# Patient Record
Sex: Male | Born: 1975
Health system: Southern US, Community
[De-identification: ages and names within clinical notes are randomized; demographics above are authoritative.]

## PROBLEM LIST (undated history)

## (undated) DIAGNOSIS — R569 Unspecified convulsions: Secondary | ICD-10-CM

## (undated) DIAGNOSIS — I10 Essential (primary) hypertension: Secondary | ICD-10-CM

## (undated) DIAGNOSIS — J449 Chronic obstructive pulmonary disease, unspecified: Secondary | ICD-10-CM

## (undated) HISTORY — PX: PROSTATE SURGERY: SHX751

## (undated) HISTORY — PX: BRAIN SURGERY: SHX531

## (undated) HISTORY — PX: HERNIA REPAIR: SHX51

---

## 2004-05-05 ENCOUNTER — Emergency Department (HOSPITAL_COMMUNITY): Admission: EM | Admit: 2004-05-05 | Discharge: 2004-05-06 | Payer: Self-pay | Admitting: Emergency Medicine

## 2007-09-21 ENCOUNTER — Emergency Department: Payer: Self-pay | Admitting: Emergency Medicine

## 2008-09-06 ENCOUNTER — Observation Stay (HOSPITAL_COMMUNITY): Admission: EM | Admit: 2008-09-06 | Discharge: 2008-09-08 | Payer: Self-pay | Admitting: Emergency Medicine

## 2008-09-06 ENCOUNTER — Encounter (INDEPENDENT_AMBULATORY_CARE_PROVIDER_SITE_OTHER): Payer: Self-pay | Admitting: Cardiology

## 2008-09-06 ENCOUNTER — Encounter (INDEPENDENT_AMBULATORY_CARE_PROVIDER_SITE_OTHER): Payer: Self-pay | Admitting: *Deleted

## 2008-09-08 ENCOUNTER — Encounter (INDEPENDENT_AMBULATORY_CARE_PROVIDER_SITE_OTHER): Payer: Self-pay | Admitting: Internal Medicine

## 2008-09-08 ENCOUNTER — Ambulatory Visit: Payer: Self-pay | Admitting: Surgery

## 2008-09-23 ENCOUNTER — Emergency Department (HOSPITAL_COMMUNITY): Admission: EM | Admit: 2008-09-23 | Discharge: 2008-09-24 | Payer: Self-pay | Admitting: Emergency Medicine

## 2008-10-18 ENCOUNTER — Inpatient Hospital Stay (HOSPITAL_COMMUNITY): Admission: AD | Admit: 2008-10-18 | Discharge: 2008-10-21 | Payer: Self-pay | Admitting: Pediatrics

## 2009-07-29 ENCOUNTER — Emergency Department (HOSPITAL_COMMUNITY): Admission: EM | Admit: 2009-07-29 | Discharge: 2009-07-30 | Payer: Self-pay | Admitting: Emergency Medicine

## 2009-10-16 ENCOUNTER — Emergency Department (HOSPITAL_COMMUNITY): Admission: EM | Admit: 2009-10-16 | Discharge: 2009-10-16 | Payer: Self-pay | Admitting: Emergency Medicine

## 2010-08-06 LAB — PROTIME-INR
INR: 1.12 (ref 0.00–1.49)
Prothrombin Time: 14.3 seconds (ref 11.6–15.2)

## 2010-08-06 LAB — SAMPLE TO BLOOD BANK

## 2010-08-06 LAB — DIFFERENTIAL
Basophils Absolute: 0 10*3/uL (ref 0.0–0.1)
Basophils Relative: 0 % (ref 0–1)
Eosinophils Absolute: 0.1 10*3/uL (ref 0.0–0.7)
Eosinophils Relative: 1 % (ref 0–5)
Lymphocytes Relative: 26 % (ref 12–46)
Lymphs Abs: 2.7 10*3/uL (ref 0.7–4.0)
Monocytes Absolute: 0.6 10*3/uL (ref 0.1–1.0)
Monocytes Relative: 6 % (ref 3–12)
Neutro Abs: 6.9 10*3/uL (ref 1.7–7.7)
Neutrophils Relative %: 67 % (ref 43–77)

## 2010-08-06 LAB — CBC
HCT: 30.1 % — ABNORMAL LOW (ref 39.0–52.0)
Hemoglobin: 10.2 g/dL — ABNORMAL LOW (ref 13.0–17.0)
MCHC: 34 g/dL (ref 30.0–36.0)
MCV: 94.6 fL (ref 78.0–100.0)
Platelets: 295 10*3/uL (ref 150–400)
RBC: 3.18 MIL/uL — ABNORMAL LOW (ref 4.22–5.81)
RDW: 13.6 % (ref 11.5–15.5)
WBC: 10.4 10*3/uL (ref 4.0–10.5)

## 2010-08-06 LAB — BASIC METABOLIC PANEL
BUN: 15 mg/dL (ref 6–23)
CO2: 17 mEq/L — ABNORMAL LOW (ref 19–32)
Calcium: 7.9 mg/dL — ABNORMAL LOW (ref 8.4–10.5)
Chloride: 109 mEq/L (ref 96–112)
Creatinine, Ser: 0.95 mg/dL (ref 0.4–1.5)
GFR calc Af Amer: >60 mL/min (ref >60)
GFR calc non Af Amer: >60 mL/min (ref >60)
Glucose, Bld: 171 mg/dL — ABNORMAL HIGH (ref 70–99)
Potassium: 2.9 mEq/L — ABNORMAL LOW (ref 3.5–5.1)
Sodium: 135 mEq/L (ref 135–145)

## 2010-08-06 LAB — APTT: aPTT: 21 seconds — ABNORMAL LOW (ref 24–37)

## 2010-08-13 LAB — PROTIME-INR
INR: 0.96 (ref 0.00–1.49)
Prothrombin Time: 12.7 seconds (ref 11.6–15.2)

## 2010-08-13 LAB — DIFFERENTIAL
Basophils Absolute: 0.1 10*3/uL (ref 0.0–0.1)
Basophils Relative: 1 % (ref 0–1)
Eosinophils Absolute: 0.7 10*3/uL (ref 0.0–0.7)
Eosinophils Relative: 5 % (ref 0–5)
Lymphocytes Relative: 15 % (ref 12–46)
Lymphs Abs: 2.1 10*3/uL (ref 0.7–4.0)
Monocytes Absolute: 1.1 10*3/uL — ABNORMAL HIGH (ref 0.1–1.0)
Monocytes Relative: 8 % (ref 3–12)
Neutro Abs: 10.1 10*3/uL — ABNORMAL HIGH (ref 1.7–7.7)
Neutrophils Relative %: 72 % (ref 43–77)

## 2010-08-13 LAB — CBC
HCT: 47.1 % (ref 39.0–52.0)
Hemoglobin: 15.7 g/dL (ref 13.0–17.0)
MCHC: 33.4 g/dL (ref 30.0–36.0)
MCV: 93.4 fL (ref 78.0–100.0)
Platelets: 287 10*3/uL (ref 150–400)
RBC: 5.04 MIL/uL (ref 4.22–5.81)
RDW: 12.7 % (ref 11.5–15.5)
WBC: 14.1 10*3/uL — ABNORMAL HIGH (ref 4.0–10.5)

## 2010-08-13 LAB — COMPREHENSIVE METABOLIC PANEL
ALT: 117 U/L — ABNORMAL HIGH (ref 0–53)
AST: 49 U/L — ABNORMAL HIGH (ref 0–37)
Albumin: 3.5 g/dL (ref 3.5–5.2)
Alkaline Phosphatase: 74 U/L (ref 39–117)
BUN: 8 mg/dL (ref 6–23)
CO2: 23 mEq/L (ref 19–32)
Calcium: 8.9 mg/dL (ref 8.4–10.5)
Chloride: 105 mEq/L (ref 96–112)
Creatinine, Ser: 0.83 mg/dL (ref 0.4–1.5)
GFR calc Af Amer: >60 mL/min (ref >60)
GFR calc non Af Amer: >60 mL/min (ref >60)
Glucose, Bld: 105 mg/dL — ABNORMAL HIGH (ref 70–99)
Potassium: 3.7 mEq/L (ref 3.5–5.1)
Sodium: 134 mEq/L — ABNORMAL LOW (ref 135–145)
Total Bilirubin: 0.5 mg/dL (ref 0.3–1.2)
Total Protein: 6.7 g/dL (ref 6.0–8.3)

## 2010-08-13 LAB — CK TOTAL AND CKMB (NOT AT ARMC)
CK, MB: 0.4 ng/mL (ref 0.3–4.0)
Relative Index: INVALID (ref 0.0–2.5)
Total CK: 53 U/L (ref 7–232)

## 2010-08-13 LAB — TROPONIN I: Troponin I: <0.01 ng/mL (ref 0.00–0.06)

## 2010-08-13 LAB — APTT: aPTT: 25 seconds (ref 24–37)

## 2010-08-27 LAB — PHENYTOIN LEVEL, TOTAL: Phenytoin Lvl: 13.7 ug/mL (ref 10.0–20.0)

## 2010-08-27 LAB — PROLACTIN
Prolactin: 13.3 ng/mL (ref 2.1–17.1)
Prolactin: 9.8 ng/mL (ref 2.1–17.1)

## 2010-08-28 LAB — CBC
HCT: 42.8 % (ref 39.0–52.0)
Hemoglobin: 14.9 g/dL (ref 13.0–17.0)
MCHC: 34.8 g/dL (ref 30.0–36.0)
MCV: 89.9 fL (ref 78.0–100.0)
Platelets: 274 K/uL (ref 150–400)
RBC: 4.77 MIL/uL (ref 4.22–5.81)
RDW: 12.6 % (ref 11.5–15.5)
WBC: 8.5 K/uL (ref 4.0–10.5)

## 2010-08-28 LAB — BASIC METABOLIC PANEL
BUN: 14 mg/dL (ref 6–23)
CO2: 24 mEq/L (ref 19–32)
Calcium: 9 mg/dL (ref 8.4–10.5)
Chloride: 110 mEq/L (ref 96–112)
Creatinine, Ser: 0.9 mg/dL (ref 0.4–1.5)
GFR calc Af Amer: >60 mL/min (ref >60)
GFR calc non Af Amer: >60 mL/min (ref >60)
Glucose, Bld: 104 mg/dL — ABNORMAL HIGH (ref 70–99)
Potassium: 3.9 mEq/L (ref 3.5–5.1)
Sodium: 139 mEq/L (ref 135–145)

## 2010-08-28 LAB — DIFFERENTIAL
Basophils Absolute: 0.1 K/uL (ref 0.0–0.1)
Basophils Relative: 2 % — ABNORMAL HIGH (ref 0–1)
Eosinophils Absolute: 0.9 10*3/uL — ABNORMAL HIGH (ref 0.0–0.7)
Eosinophils Relative: 11 % — ABNORMAL HIGH (ref 0–5)
Lymphocytes Relative: 26 % (ref 12–46)
Lymphs Abs: 2.2 10*3/uL (ref 0.7–4.0)
Monocytes Absolute: 0.7 10*3/uL (ref 0.1–1.0)
Monocytes Relative: 8 % (ref 3–12)
Neutro Abs: 4.5 K/uL (ref 1.7–7.7)
Neutrophils Relative %: 53 % (ref 43–77)

## 2010-08-28 LAB — CK: Total CK: 121 U/L (ref 7–232)

## 2010-08-29 LAB — T4, FREE: Free T4: 0.98 ng/dL (ref 0.80–1.80)

## 2010-08-29 LAB — DIFFERENTIAL
Basophils Absolute: 0.1 10*3/uL (ref 0.0–0.1)
Basophils Relative: 1 % (ref 0–1)
Eosinophils Absolute: 0.5 10*3/uL (ref 0.0–0.7)
Eosinophils Relative: 6 % — ABNORMAL HIGH (ref 0–5)
Lymphocytes Relative: 21 % (ref 12–46)
Monocytes Absolute: 0.6 10*3/uL (ref 0.1–1.0)

## 2010-08-29 LAB — URINALYSIS, ROUTINE W REFLEX MICROSCOPIC
Bilirubin Urine: NEGATIVE
Ketones, ur: NEGATIVE mg/dL
Nitrite: NEGATIVE
Protein, ur: NEGATIVE mg/dL
pH: 7 (ref 5.0–8.0)

## 2010-08-29 LAB — BASIC METABOLIC PANEL
BUN: 15 mg/dL (ref 6–23)
CO2: 29 mEq/L (ref 19–32)
Chloride: 108 mEq/L (ref 96–112)
Creatinine, Ser: 1.13 mg/dL (ref 0.4–1.5)
Glucose, Bld: 94 mg/dL (ref 70–99)

## 2010-08-29 LAB — POCT I-STAT, CHEM 8
BUN: 11 mg/dL (ref 6–23)
Hemoglobin: 16.3 g/dL (ref 13.0–17.0)
Potassium: 3.8 mEq/L (ref 3.5–5.1)
Sodium: 141 mEq/L (ref 135–145)
TCO2: 23 mmol/L (ref 0–100)

## 2010-08-29 LAB — RAPID URINE DRUG SCREEN, HOSP PERFORMED
Benzodiazepines: NOT DETECTED
Cocaine: NOT DETECTED
Tetrahydrocannabinol: NOT DETECTED

## 2010-08-29 LAB — CBC
HCT: 45.8 % (ref 39.0–52.0)
Hemoglobin: 15.8 g/dL (ref 13.0–17.0)
MCHC: 34.2 g/dL (ref 30.0–36.0)
MCHC: 34.4 g/dL (ref 30.0–36.0)
MCV: 90.5 fL (ref 78.0–100.0)
MCV: 90.7 fL (ref 78.0–100.0)
Platelets: 263 10*3/uL (ref 150–400)
Platelets: 272 10*3/uL (ref 150–400)
RBC: 5.2 MIL/uL (ref 4.22–5.81)
RDW: 11.9 % (ref 11.5–15.5)
RDW: 12 % (ref 11.5–15.5)

## 2010-08-29 LAB — APTT: aPTT: 26 seconds (ref 24–37)

## 2010-08-29 LAB — COMPREHENSIVE METABOLIC PANEL
ALT: 42 U/L (ref 0–53)
AST: 24 U/L (ref 0–37)
Alkaline Phosphatase: 62 U/L (ref 39–117)
Calcium: 8.8 mg/dL (ref 8.4–10.5)
GFR calc Af Amer: >60 mL/min (ref >60)
Potassium: 4.2 mEq/L (ref 3.5–5.1)
Sodium: 141 mEq/L (ref 135–145)
Total Protein: 6.2 g/dL (ref 6.0–8.3)

## 2010-08-29 LAB — LIPID PANEL
Cholesterol: 159 mg/dL (ref 0–200)
LDL Cholesterol: 114 mg/dL — ABNORMAL HIGH (ref 0–99)
Triglycerides: 90 mg/dL (ref <150)

## 2010-08-29 LAB — GLUCOSE, CAPILLARY

## 2010-08-29 LAB — PROTIME-INR: INR: 1.1 (ref 0.00–1.49)

## 2010-10-02 NOTE — Consult Note (Signed)
[name], [name]                 ACCOUNT NO.:  [id number]   MEDICAL RECORD NO.:  [id number]          PATIENT TYPE:  INP   LOCATION:  3309                         FACILITY:  [facility]   PHYSICIAN:  Antonietta Breach, M.D.  DATE OF BIRTH:  17-Sep-1975   DATE OF CONSULTATION:  10/20/2008  DATE OF DISCHARGE:                                 CONSULTATION   REQUESTING PHYSICIAN:  Dr. Pearlean Brownie.   REASON FOR CONSULTATION:  Convulsions without seizure activity.   HISTORY OF PRESENT ILLNESS:  [name] is a 35 year old male admitted to  the [facility] on October 18, 2008, with convulsions.  He has had a  pattern of having generalized shaking in the context of interpersonal  stress in the past.  Most recently, he does not describe any stresses in  his life interpersonally.  He does live with his mother.  He states that  they occasionally have some arguments, but for the most part they are  mutually supportive.  He does work for her around the house.   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  He has displayed generalized shaking  during this hospital course where the neurologist was able was able to  talk him out of the convulsion and [name] was able to converse with  the neurologist during the convulsion.   [name] describes normal mood.  He has normal interests and  constructive future goals.  He does not have any thoughts of harming  himself or others.  He has no hallucinations or delusions.  His  orientation and memory function are intact.   He talks about his interests in fishing and reports that there [place]  [place] home soon.  He looks forward to fishing there.  He does not exhibit any flatness of expression.  Notably, he does not  mention the convulsive activity as a stress in his life when asked about  stress.   PAST PSYCHIATRIC HISTORY:  Unremarkable other than a previous pattern of  convulsions when he  has been having interpersonal difficulty with  disappointing male relationships.   FAMILY PSYCHIATRIC HISTORY:  None known.   SOCIAL HISTORY:  [name] does not use alcohol or illegal drugs.  His  occupation is Holiday representative; however, he has been unemployed due to the  downturn in economy and he has been forced to move back in with his  mother.  Please see the discussion above.   PAST MEDICAL HISTORY:  1. He does have a history of extensive sinus surgery.  2. Convulsions.   Please see Dr. Darl Householder dictation from October 18, 2008.   MEDICATIONS:  The MAR is reviewed.  He is on Diovan 100 mg t.i.d.   ALLERGIES:  HE IS ALLERGIC TO ALLEGRA.   GENERAL APPEARANCE:  [name] is a middle-aged male lying in a supine  position in his hospital bed with no abnormal involuntary movements.   MENTAL STATUS EXAM:  [name] is alert.  His eye contact is good.  His  affect is broad and appropriate.  His mood is mood is within normal  limits.  His concentration is normal.  He is oriented to all spheres.  His memory is intact for immediate, recent and remote.  His speech  involves normal rate and prosody without dysarthria.  He smiles  appropriately as he discusses his fishing interests.  Thought process is  logical, coherent and goal-directed.  No looseness of associations.  Thought content; no thoughts of harming himself, no thoughts of harming  others, no delusions or hallucinations.  Please see the above regarding  the absence of mentioning convulsions as a stress.  His insight is  partial in that he is least verbalized his willingness to accept that he  could have nonictal convulsions produced at the unconscious level.  Judgment is intact.   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  [name] does display some evidence of indifference to his  convulsions during the session.  He is undergoing the stress of having  to move back in with his mother during  an economic downturn and he  voices this as his #1 stress.  Axis II:  Deferred.  Axis III:  See past medical history.  Axis IV:  Economic occupational.  Axis V:  Global Assessment of Functioning 55.   [name] is not at risk to harm himself or others.  He agrees to call  emergency services for any psychiatric emergency symptoms.   RECOMMENDATIONS:  I would have the social worker set [name] up with  psychotherapy.  The psychotherapy needs to be performed by a therapist  trained and experienced in treating somatoform conditions.      Antonietta Breach, M.D.  Electronically Signed     JW/MEDQ  D:  10/20/2008  T:  10/20/2008  Job:  [id number]

## 2010-10-02 NOTE — H&P (Signed)
NAMEDIONNE, ROSSA                 ACCOUNT NO.:  [id number]   MEDICAL RECORD NO.:  [id number]          PATIENT TYPE:  EMS   LOCATION:  MAJO                         FACILITY:  [facility]   PHYSICIAN:  Selena Batten, MD     DATE OF BIRTH:  Nov 26, 1975   DATE OF ADMISSION:  09/05/2008  DATE OF DISCHARGE:                              HISTORY & PHYSICAL   CHIEF COMPLAINT:  Right-sided weakness.   HISTORY OF PRESENT ILLNESS:  This is a 35 year old gentleman with  chronic sinusitis who presents to ED for further evaluation for  generalized weakness.  The patient awoke this morning at 7 a.m. and  noted that right side of his body felt with some numbness and tingling  across it.  After lunch it progressed to the point that he had a  weakness, in particular in the right arm.  He has not had these symptoms  previously.  He denies any recent episodes of fever, chills, nausea,  vomiting.  He denies any episodes of slurred speech.  He has had recent  headaches related to allergies.  He denies any chest pain, shortness of  breath, or dyspnea on exertion.  He denies any orthopnea, PND, or lower  extremity edema.   PAST MEDICAL HISTORY:  Chronic sinusitis.   ALLERGIES:  He is allergic to Allegra-D.   MEDICATIONS:  He is on no medications at home at the present time.   FAMILY HISTORY:  Noncontributory.   SOCIAL HISTORY:  He smokes.  He works as a Corporate investment banker.   REVIEW OF SYSTEMS:  A 14-point review of systems was performed and was  negative except as per HPI.   PHYSICAL EXAMINATION:  VITAL SIGNS:  Blood pressure is 128/86 with a  pulse of 76, and sating 100% on room air.  GENERAL:  No acute distress.  HEAD:  Normocephalic and atraumatic.  EYES:  Pupils equal, round, and reactive to light.  Extraocular muscles  are intact.  NECK:  Supple.  No masses.  No bruits.  No thyromegaly.  LUNGS:  Clear to auscultation bilaterally.  HEART:  Regular rate and rhythm.  No murmur, rubs, or gallops.  ABDOMEN:  Positive bowel sounds, soft, nontender, and nondistended.  EXTREMITIES:  No clubbing, cyanosis, or edema.  NEUROLOGIC:  The patient has right arm weakness relative to the left arm  PSYCHIATRIC:  He has appropriate mood, judgment, and insight.   LABORATORY EVALUATION:  Unrevealing.  CT scan was negative for any acute  abnormality.   ASSESSMENT:  This 35 year old Caucasian male with chronic sinusitis here  with right-sided general weakness.  Generalized weakness.  He spoke to Neurology and they will evaluate the  patient in the morning.  CT scan unrevealing for any acute abnormality.  We will have the patient on aspirin.  We will have the patient set up  for a transthoracic echo with bubble study to evaluate for any right to  left shunting.  In addition, we will put in for carotid ultrasounds to  evaluate for any carotid disease that may have resulted in his symptoms.   DISPOSITION:  Put in for the workup.      Selena Batten, MD  Electronically Signed     BS/MEDQ  D:  09/05/2008  T:  09/06/2008  Job:  [id number]

## 2010-10-02 NOTE — Procedures (Signed)
CLINICAL HISTORY:  This is a 35 year old gentleman, who has had seizure-  like activity noted since 1995.  He has been diagnosed with epilepsy.  He has been on a variety of medications most recently Dilantin.  He has  had exacerbations of his seizures during times of intensive motional  stress.  The patient has had several sinus surgeries.  He also claims to  have had several head injuries. (780.39)   PROCEDURE:  The tracing is carried out on a 32-digital Cadwell recorder  reformatted into 16 channel montages with one devoted to EKG.  The  patient was awake and drowsy during the recording.  Little if any stage  II sleep was seen.  He was monitored for 20.1 hours.  The [employer]  10/20 system lead placement was used.   Medications include Dilantin, Tylenol, and Vicodin.   DESCRIPTION OF FINDINGS:  Dominant frequency during the record is 9-10  Hz 30-35 microvolt alpha range activity that is posteriorly predominant,  broadly distributed, and attenuates with eye opening.  Mixed frequency  lower alpha, upper theta, and frontally predominant beta range activity  characterizes the waking record.   The patient shows drowsiness with mixed frequency theta and upper delta  range activity that is also broadly distributed.  I did not see vertex  sharp waves were symmetric and synchronous.  Sleep spindles during the  entire record.   The patient had 5 episodes of seizure-like behavior during which time  his eyelids were closed.  He turned his head to one side intended to  flex and extend his head, on occasion move it from side to side, and had  rhythmic movements of his arms either in flexion and extension, or again  sometimes from side-to-side.  The first episode extended from 10:00 a.m.  to 10:15 p.m. the second from 11 a.m. to 11:13 a.m., the third from  12:02 p.m. to 12:09 p.m., and forth from 3:45 p.m. to 4:07 p.m., the  last from 9:26 p.m. to 9:45 p.m.   During this time, the  background which shifts from the waking record to  semirhythmic occipital delta range activity usually on the right side  (if his head was turned to the right, otherwise on the left side of his  head was turned to the left.)  Movement and artifact characterized that  these behaviors at times in generalized delta range activity that was  different voltages and somewhat asynchronous characterized the  background.  Throughout all of these periods of time, a low-voltage  background to be seen in multiple leads that were not affected by  movement of his head.  Upon termination of the activity, the waking  record immediately returned.  The episodes were fairly consistent from  time to time and were unassociated with other changes in behavior.   IMPRESSION:  This prolonged video telemetry electroencephalogram shows 5  nonepileptic events that are characteristic of the seizure-like behavior  that has been reported by the patient then seen by numerous witnesses,  but there was no interictal epileptiform activity in the form of spikes  or sharp waves in this record.      Deanna Artis. Sharene Skeans, M.D.  Electronically Signed     ZOX:WRUE  D:  10/21/2008 04:54:22  T:  10/21/2008 06:48:45  Job #:  [id number]

## 2010-10-02 NOTE — Discharge Summary (Signed)
[name], [name]                 ACCOUNT NO.:  [id number]   MEDICAL RECORD NO.:  [id number]          PATIENT TYPE:  INP   LOCATION:  3734                         FACILITY:  [facility]   PHYSICIAN:  Deanna Artis. 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  2. Antalgic gait 781.2.  3. Somatoform disorder, not otherwise specified.   CONSULTS:  Antonietta Breach, MD   DEPARTMENT:  Psychiatry.   SUMMARY OF THE HOSPITALIZATION:  The patient was admitted to [facility] in transfer from [facility] for recurrent seizures that  appeared to be intractable despite treatment with IV Dilantin, three  doses of IV had a than, and finally stop when the patient was put to  sleep with IV phenobarbital.   He was transferred for prolonged video telemetry EEG, which was carried  out between October 19, 2008 and October 20, 2008, interpreted earlier this  morning.   Numerous observers witnessed the behaviors, which associated with slight  extension and flexion of the head, movement of his arms in flexion and  extension, eyelids closed.  The patient was poorly responsive during the  episodes, but immediately upon cessation of them was able to follow  commands even though he did not speak.  The patient was stimulated with  bright light in his eyes, which he is averse to and returned away.  Ammonia placed under his nose increased the amount of his movements.   Five episodes were captured on video EEG, all of them looking the same.  All were associated with movement artifact with normal EEG able to be  seen in those sleeves that were not moving and normal EEG background  immediately on cessation of the movements.   The patient had normal background awake and drowsy.  I did not see light  natural sleep during the EEG.   PHYSICAL  EXAMINATION:  VITAL SIGNS:  Today, blood pressure 105/60,  resting pulse 61, respirations of 16, temperature 97.2, and oxygen  saturation 98% on room air.  GENERAL:  The patient is awake, sleepy.  No dysphasia.  LUNGS:  Clear.  HEART:  No murmurs.  ABDOMEN:  Protuberant.  Bowel sounds normal.  EXTREMITIES:  Scar on the left leg, but no edema.  NEUROLOGIC:  Round and reactive pupils.  Visual fields full to double  simultaneous stimuli.  Symmetric facial strength, midline tongue.  Air  conduction greater than bone conduction bilaterally.  Motor examination:  Normal axial strength in his arms and legs bilaterally.  No drift.  Fine  motor movements normal.  Sensation:  No hemisensory.  Good  stereoagnosis.  Gait:  Antalgic.  The patient is hobbling on his right  leg and says that he has to walk with a cane.  Deep tendon reflexes are  diminished.  The patient had bilateral flexor plantar responses.  CT  scan of the brain shows no evidence of stroke and is identical with CT  performed here  on Sep 23, 2008.  Chest x-ray shows poor inspiration.   LABORATORY STUDIES DURING HOSPITALIZATION:  Prolactin level at baseline  9.8 ng/mL.  Prolactin level during his seizure-like event 13.3 ng/mL.  Dilantin level after a loading dose of Dilantin 13.7 mcg/mL.   Review of the transcription shows he was seen in the emergency room with  a nonepileptic event on Sep 24, 2008, and was discharged home.   I have conveyed to the patient that the behaviors that he has at this  time are nonepileptic and that there is no specific treatment for them.   Recommendations had been made by Dr. Jeanie Sewer for the patient to seek  care in [facility] with a psychotherapist who specializes in  somatoform disorders.  I know none.  The patient will go home on  Dilantin at his same dose.  I do not know in the past if he truly has  had seizures.  He will not be followed up at [facility].  I encouraged to  the patient and his family to keep their  appointment at [facility] on November 03, 2008, for further evaluation.  Since they had seen the patient on  and off since 1995, when these episodes began, he may have more  definitive information as to whether the patient truly has seizures or  whether he has had nonepileptic seizures since that time.      Deanna Artis. Sharene Skeans, M.D.  Electronically Signed     WHH/MEDQ  D:  10/21/2008  T:  10/21/2008  Job:  [id number]   cc:   [facility] Records

## 2010-10-02 NOTE — Discharge Summary (Signed)
NAMEDJON, TITH                 ACCOUNT NO.:  [id number]   MEDICAL RECORD NO.:  [id number]          PATIENT TYPE:  INP   LOCATION:  3034                         FACILITY:  [facility]   PHYSICIAN:  Elliot Cousin, M.D.    DATE OF BIRTH:  [birth year]   DATE OF ADMISSION:  09/05/2008  DATE OF DISCHARGE:  09/08/2008                               DISCHARGE SUMMARY   DISCHARGE DIAGNOSES:  1. Right-sided weakness with paresthesias, etiology possibly      musculoskeletal in origin versus somatization.  2. Mildly elevated LDL cholesterol with normal total cholesterol.  3. Tobacco abuse.   DISCHARGE MEDICATIONS:  Take Tylenol or ibuprofen as directed on the  label and as needed for pain.   DISCHARGE DISPOSITION:  The patient is currently stable and in improved  condition.  He was advised to follow up with the physicians at [facility] in [place], [place] as needed.  He was advised to return  to work on September 12, 2008.   CONSULTATIONS:  None.   PROCEDURES PERFORMED:  1. Carotid duplex study performed on July 11, 2008.  The results      revealed no ICA stenosis.  2. MRI of the brain and MRA of the brain on September 07, 2008.  The      results revealed normal MRI appearance of the brain.  Pansinus      inflammatory changes.  Negative intracranial MRA.  3. MRA of the neck on September 07, 2008.  The results revealed negative      MRA of the neck aside from unusual anatomic variation of the aortic      arch and right vertebral artery origin as detailed.  4. CT scan of the head on September 05, 2008.  The results revealed no      acute intracranial findings.  Chronic sinus disease.  Little change      from priors.  5. It is unclear if 2-D echocardiogram was actually performed,      although it was ordered.  No results were evident.   HISTORY OF PRESENT ILLNESS:  The patient is a 35 year old man with a  past medical history significant for chronic sinusitis and sinus  surgery, who presented  to the emergency department on September 05, 2008  with a chief complaint of right-sided weakness with associated numbness  and tingling.  When he was evaluated in the emergency department, he was  noted to be hemodynamically stable and afebrile.  A CT scan of his head  was ordered and it revealed no acute intracranial abnormality, although  there were signs of chronic sinus disease.  The patient, however, was  admitted for further evaluation and management.   For additional details, please see the dictated history and physical.   HOSPITAL COURSE:  1. RIGHT-SIDED WEAKNESS SECONDARY TO EITHER MUSCULOSKELETAL STRAIN OR      SOMATIZATION.  The patient was evaluated by the registered nurse in      the emergency department for dysphagia.  The initial bedside      swallowing study was within normal limits.  The  patient was started      on aspirin therapy empirically.  A number of studies were ordered      for evaluation of his symptoms, including a urine drug screen,      hemoglobin A1c, fasting lipid panel, 2-D echocardiogram, carotid      Doppler study, MRI of the brain, and MRA of the brain and neck.      The patient's urine drug screen was negative.  His hemoglobin A1c      was within normal limits at 5.5.  His lipid profile revealed a      total cholesterol of 159, triglycerides of 90, HDL cholesterol of      27, and LDL cholesterol of 114.  His electrolytes were within      normal limits and his CBC was within normal limits as well.  TSH      and free T4 were ordered, however, the results are pending.  His      carotid Doppler study revealed no ICA stenosis.  The MRA and MRI      results are above, however, in essence, they were essentially      normal.  The patient was evaluated by the speech therapist when he said he was  having some difficulty swallowing.  The speech therapist evaluated the  patient and saw no evidence of dysphagia or aspiration.  At one point,  the patient was  witnessed ambulating in his room without any significant  weakness.  He had no complaints of neck pain or low back pain.  On my  initial examination on September 07, 2008, the patient was able to pull back  the bed linen with his right hand, although when I actively performed  the strength exam, he demonstrated a weak right hand grip.  Also on  exam, there was no evidence of a right pronator drift.  On my exam  today, the right upper extremity strength was within normal limits.  I  then asked the patient to ambulate and he ambulated without foot drag,  however, there was a limp.  I therefore examined his right lower  extremity and he did have some mild tenderness of the right hip upon  external and internal rotation.  No edema of his right lower extremity  was evident.  There was also no evidence of erythema of his right hip.  Sensation of his right lower extremity was completely intact.  The patient did not appear to have numbness and tingling.  His affect  was somewhat flat on exam.  It is possible that the patient may have  some form of somatization.  It is also possible that he may have some  underlying musculoskeletal strain that presented as weakness.  However,  the right upper extremity weakness which has now resolved, cannot be  explained.  The patient was advised to take Tylenol and/or ibuprofen as  needed.  A cane was ordered prior to hospital discharge.  1. MILDLY ELEVATED LDL AND TOBACCO ABUSE.  The patient was counseled      on a low-fat diet.  He was encouraged to stop smoking.  Tobacco      cessation information was provided to the patient prior to hospital      discharge.   </ADDENDUM>/  The patient's TSH and free T4 were within normal limits.      Elliot Cousin, M.D.  Electronically Signed     DF/MEDQ  D:  09/08/2008  T:  09/08/2008  Job:  [id number] 
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
## 2015-06-06 DIAGNOSIS — R748 Abnormal levels of other serum enzymes: Secondary | ICD-10-CM | POA: Diagnosis not present

## 2015-06-06 DIAGNOSIS — E119 Type 2 diabetes mellitus without complications: Secondary | ICD-10-CM | POA: Diagnosis not present

## 2015-06-13 DIAGNOSIS — Z79899 Other long term (current) drug therapy: Secondary | ICD-10-CM | POA: Diagnosis not present

## 2015-06-26 DIAGNOSIS — R200 Anesthesia of skin: Secondary | ICD-10-CM | POA: Diagnosis not present

## 2015-06-26 DIAGNOSIS — R531 Weakness: Secondary | ICD-10-CM | POA: Diagnosis not present

## 2015-06-26 DIAGNOSIS — I6789 Other cerebrovascular disease: Secondary | ICD-10-CM | POA: Diagnosis not present

## 2015-07-14 DIAGNOSIS — F445 Conversion disorder with seizures or convulsions: Secondary | ICD-10-CM | POA: Diagnosis not present

## 2015-07-14 DIAGNOSIS — G4459 Other complicated headache syndrome: Secondary | ICD-10-CM | POA: Diagnosis not present

## 2015-07-17 DIAGNOSIS — E119 Type 2 diabetes mellitus without complications: Secondary | ICD-10-CM | POA: Diagnosis not present

## 2015-07-17 DIAGNOSIS — M5137 Other intervertebral disc degeneration, lumbosacral region: Secondary | ICD-10-CM | POA: Diagnosis not present

## 2015-07-17 DIAGNOSIS — E790 Hyperuricemia without signs of inflammatory arthritis and tophaceous disease: Secondary | ICD-10-CM | POA: Diagnosis not present

## 2015-07-17 DIAGNOSIS — R252 Cramp and spasm: Secondary | ICD-10-CM | POA: Diagnosis not present

## 2015-07-17 DIAGNOSIS — R569 Unspecified convulsions: Secondary | ICD-10-CM | POA: Diagnosis not present

## 2015-07-17 DIAGNOSIS — R269 Unspecified abnormalities of gait and mobility: Secondary | ICD-10-CM | POA: Diagnosis not present

## 2015-07-17 DIAGNOSIS — G40909 Epilepsy, unspecified, not intractable, without status epilepticus: Secondary | ICD-10-CM | POA: Diagnosis not present

## 2015-07-17 DIAGNOSIS — I10 Essential (primary) hypertension: Secondary | ICD-10-CM | POA: Diagnosis not present

## 2015-07-17 DIAGNOSIS — E785 Hyperlipidemia, unspecified: Secondary | ICD-10-CM | POA: Diagnosis not present

## 2015-07-17 DIAGNOSIS — E039 Hypothyroidism, unspecified: Secondary | ICD-10-CM | POA: Diagnosis not present

## 2015-07-17 DIAGNOSIS — E1165 Type 2 diabetes mellitus with hyperglycemia: Secondary | ICD-10-CM | POA: Diagnosis not present

## 2015-07-17 DIAGNOSIS — E669 Obesity, unspecified: Secondary | ICD-10-CM | POA: Diagnosis not present

## 2015-07-17 DIAGNOSIS — E782 Mixed hyperlipidemia: Secondary | ICD-10-CM | POA: Diagnosis not present

## 2015-07-24 DIAGNOSIS — J45901 Unspecified asthma with (acute) exacerbation: Secondary | ICD-10-CM | POA: Diagnosis present

## 2015-07-24 DIAGNOSIS — Z880 Allergy status to penicillin: Secondary | ICD-10-CM | POA: Diagnosis not present

## 2015-07-24 DIAGNOSIS — J9621 Acute and chronic respiratory failure with hypoxia: Secondary | ICD-10-CM | POA: Diagnosis present

## 2015-07-24 DIAGNOSIS — R0602 Shortness of breath: Secondary | ICD-10-CM | POA: Diagnosis not present

## 2015-07-24 DIAGNOSIS — Z79899 Other long term (current) drug therapy: Secondary | ICD-10-CM | POA: Diagnosis not present

## 2015-07-24 DIAGNOSIS — J209 Acute bronchitis, unspecified: Secondary | ICD-10-CM | POA: Diagnosis present

## 2015-07-24 DIAGNOSIS — F319 Bipolar disorder, unspecified: Secondary | ICD-10-CM | POA: Diagnosis present

## 2015-07-24 DIAGNOSIS — J441 Chronic obstructive pulmonary disease with (acute) exacerbation: Secondary | ICD-10-CM | POA: Diagnosis present

## 2015-07-24 DIAGNOSIS — J440 Chronic obstructive pulmonary disease with acute lower respiratory infection: Secondary | ICD-10-CM | POA: Diagnosis present

## 2015-07-24 DIAGNOSIS — K219 Gastro-esophageal reflux disease without esophagitis: Secondary | ICD-10-CM | POA: Diagnosis present

## 2015-07-24 DIAGNOSIS — J9690 Respiratory failure, unspecified, unspecified whether with hypoxia or hypercapnia: Secondary | ICD-10-CM | POA: Diagnosis not present

## 2015-07-24 DIAGNOSIS — G40909 Epilepsy, unspecified, not intractable, without status epilepticus: Secondary | ICD-10-CM | POA: Diagnosis present

## 2015-07-24 DIAGNOSIS — J45909 Unspecified asthma, uncomplicated: Secondary | ICD-10-CM | POA: Diagnosis not present

## 2015-07-24 DIAGNOSIS — J9620 Acute and chronic respiratory failure, unspecified whether with hypoxia or hypercapnia: Secondary | ICD-10-CM | POA: Diagnosis not present

## 2015-07-24 DIAGNOSIS — R69 Illness, unspecified: Secondary | ICD-10-CM | POA: Diagnosis not present

## 2015-07-24 DIAGNOSIS — F17210 Nicotine dependence, cigarettes, uncomplicated: Secondary | ICD-10-CM | POA: Diagnosis present

## 2015-07-24 DIAGNOSIS — I10 Essential (primary) hypertension: Secondary | ICD-10-CM | POA: Diagnosis present

## 2015-07-24 DIAGNOSIS — Z888 Allergy status to other drugs, medicaments and biological substances status: Secondary | ICD-10-CM | POA: Diagnosis not present

## 2015-07-24 DIAGNOSIS — J9600 Acute respiratory failure, unspecified whether with hypoxia or hypercapnia: Secondary | ICD-10-CM | POA: Diagnosis not present

## 2015-07-24 DIAGNOSIS — Z8673 Personal history of transient ischemic attack (TIA), and cerebral infarction without residual deficits: Secondary | ICD-10-CM | POA: Diagnosis not present

## 2015-09-08 DIAGNOSIS — M5416 Radiculopathy, lumbar region: Secondary | ICD-10-CM | POA: Diagnosis not present

## 2015-09-08 DIAGNOSIS — F329 Major depressive disorder, single episode, unspecified: Secondary | ICD-10-CM | POA: Diagnosis not present

## 2015-09-08 DIAGNOSIS — G894 Chronic pain syndrome: Secondary | ICD-10-CM | POA: Diagnosis not present

## 2015-09-08 DIAGNOSIS — M5136 Other intervertebral disc degeneration, lumbar region: Secondary | ICD-10-CM | POA: Diagnosis not present

## 2015-10-12 DIAGNOSIS — E782 Mixed hyperlipidemia: Secondary | ICD-10-CM | POA: Diagnosis not present

## 2015-10-12 DIAGNOSIS — F411 Generalized anxiety disorder: Secondary | ICD-10-CM | POA: Diagnosis not present

## 2015-10-12 DIAGNOSIS — E6609 Other obesity due to excess calories: Secondary | ICD-10-CM | POA: Diagnosis not present

## 2015-10-12 DIAGNOSIS — E1165 Type 2 diabetes mellitus with hyperglycemia: Secondary | ICD-10-CM | POA: Diagnosis not present

## 2015-10-12 DIAGNOSIS — G40909 Epilepsy, unspecified, not intractable, without status epilepticus: Secondary | ICD-10-CM | POA: Diagnosis not present

## 2015-10-12 DIAGNOSIS — R05 Cough: Secondary | ICD-10-CM | POA: Diagnosis not present

## 2015-10-12 DIAGNOSIS — I10 Essential (primary) hypertension: Secondary | ICD-10-CM | POA: Diagnosis not present

## 2015-10-12 DIAGNOSIS — E790 Hyperuricemia without signs of inflammatory arthritis and tophaceous disease: Secondary | ICD-10-CM | POA: Diagnosis not present

## 2015-10-12 DIAGNOSIS — R569 Unspecified convulsions: Secondary | ICD-10-CM | POA: Diagnosis not present

## 2015-10-12 DIAGNOSIS — E559 Vitamin D deficiency, unspecified: Secondary | ICD-10-CM | POA: Diagnosis not present

## 2015-10-12 DIAGNOSIS — E119 Type 2 diabetes mellitus without complications: Secondary | ICD-10-CM | POA: Diagnosis not present

## 2015-10-12 DIAGNOSIS — E039 Hypothyroidism, unspecified: Secondary | ICD-10-CM | POA: Diagnosis not present

## 2015-10-12 DIAGNOSIS — R252 Cramp and spasm: Secondary | ICD-10-CM | POA: Diagnosis not present

## 2015-10-12 DIAGNOSIS — F17210 Nicotine dependence, cigarettes, uncomplicated: Secondary | ICD-10-CM | POA: Diagnosis not present

## 2015-10-15 DIAGNOSIS — R569 Unspecified convulsions: Secondary | ICD-10-CM | POA: Diagnosis not present

## 2015-10-15 DIAGNOSIS — G4089 Other seizures: Secondary | ICD-10-CM | POA: Diagnosis not present

## 2015-10-15 DIAGNOSIS — R079 Chest pain, unspecified: Secondary | ICD-10-CM | POA: Diagnosis not present

## 2015-10-15 DIAGNOSIS — R0789 Other chest pain: Secondary | ICD-10-CM | POA: Diagnosis not present

## 2015-10-16 DIAGNOSIS — T310 Burns involving less than 10% of body surface: Secondary | ICD-10-CM | POA: Diagnosis not present

## 2015-10-16 DIAGNOSIS — T300 Burn of unspecified body region, unspecified degree: Secondary | ICD-10-CM | POA: Diagnosis not present

## 2015-10-16 DIAGNOSIS — T25299A Burn of second degree of multiple sites of unspecified ankle and foot, initial encounter: Secondary | ICD-10-CM | POA: Diagnosis not present

## 2015-10-16 DIAGNOSIS — T25212A Burn of second degree of left ankle, initial encounter: Secondary | ICD-10-CM | POA: Diagnosis not present

## 2015-10-16 DIAGNOSIS — X0801XA Exposure to bed fire due to burning cigarette, initial encounter: Secondary | ICD-10-CM | POA: Diagnosis not present

## 2015-10-16 DIAGNOSIS — T25211A Burn of second degree of right ankle, initial encounter: Secondary | ICD-10-CM | POA: Diagnosis not present

## 2015-10-23 DIAGNOSIS — T310 Burns involving less than 10% of body surface: Secondary | ICD-10-CM | POA: Diagnosis not present

## 2015-10-23 DIAGNOSIS — G4730 Sleep apnea, unspecified: Secondary | ICD-10-CM | POA: Diagnosis not present

## 2015-10-23 DIAGNOSIS — F17200 Nicotine dependence, unspecified, uncomplicated: Secondary | ICD-10-CM | POA: Diagnosis not present

## 2015-10-23 DIAGNOSIS — I10 Essential (primary) hypertension: Secondary | ICD-10-CM | POA: Diagnosis not present

## 2015-10-23 DIAGNOSIS — E119 Type 2 diabetes mellitus without complications: Secondary | ICD-10-CM | POA: Diagnosis not present

## 2015-10-23 DIAGNOSIS — J45909 Unspecified asthma, uncomplicated: Secondary | ICD-10-CM | POA: Diagnosis not present

## 2015-10-23 DIAGNOSIS — J449 Chronic obstructive pulmonary disease, unspecified: Secondary | ICD-10-CM | POA: Diagnosis not present

## 2015-10-23 DIAGNOSIS — G40909 Epilepsy, unspecified, not intractable, without status epilepticus: Secondary | ICD-10-CM | POA: Diagnosis not present

## 2015-10-23 DIAGNOSIS — T24202A Burn of second degree of unspecified site of left lower limb, except ankle and foot, initial encounter: Secondary | ICD-10-CM | POA: Diagnosis not present

## 2015-10-23 DIAGNOSIS — T24232A Burn of second degree of left lower leg, initial encounter: Secondary | ICD-10-CM | POA: Diagnosis not present

## 2015-10-26 DIAGNOSIS — Z79899 Other long term (current) drug therapy: Secondary | ICD-10-CM | POA: Diagnosis not present

## 2015-10-26 DIAGNOSIS — Z8673 Personal history of transient ischemic attack (TIA), and cerebral infarction without residual deficits: Secondary | ICD-10-CM | POA: Diagnosis not present

## 2015-10-26 DIAGNOSIS — E785 Hyperlipidemia, unspecified: Secondary | ICD-10-CM | POA: Diagnosis not present

## 2015-10-26 DIAGNOSIS — E119 Type 2 diabetes mellitus without complications: Secondary | ICD-10-CM | POA: Diagnosis not present

## 2015-10-26 DIAGNOSIS — G40909 Epilepsy, unspecified, not intractable, without status epilepticus: Secondary | ICD-10-CM | POA: Diagnosis not present

## 2015-10-26 DIAGNOSIS — T24032A Burn of unspecified degree of left lower leg, initial encounter: Secondary | ICD-10-CM | POA: Diagnosis not present

## 2015-10-26 DIAGNOSIS — F419 Anxiety disorder, unspecified: Secondary | ICD-10-CM | POA: Diagnosis not present

## 2015-10-26 DIAGNOSIS — S81802A Unspecified open wound, left lower leg, initial encounter: Secondary | ICD-10-CM | POA: Diagnosis not present

## 2015-10-26 DIAGNOSIS — G4730 Sleep apnea, unspecified: Secondary | ICD-10-CM | POA: Diagnosis not present

## 2015-10-26 DIAGNOSIS — J45909 Unspecified asthma, uncomplicated: Secondary | ICD-10-CM | POA: Diagnosis not present

## 2015-10-26 DIAGNOSIS — I10 Essential (primary) hypertension: Secondary | ICD-10-CM | POA: Diagnosis not present

## 2015-10-26 DIAGNOSIS — J449 Chronic obstructive pulmonary disease, unspecified: Secondary | ICD-10-CM | POA: Diagnosis not present

## 2015-10-26 DIAGNOSIS — K219 Gastro-esophageal reflux disease without esophagitis: Secondary | ICD-10-CM | POA: Diagnosis not present

## 2015-10-26 DIAGNOSIS — F319 Bipolar disorder, unspecified: Secondary | ICD-10-CM | POA: Diagnosis not present

## 2015-10-26 DIAGNOSIS — T310 Burns involving less than 10% of body surface: Secondary | ICD-10-CM | POA: Diagnosis not present

## 2015-10-26 DIAGNOSIS — T24202A Burn of second degree of unspecified site of left lower limb, except ankle and foot, initial encounter: Secondary | ICD-10-CM | POA: Diagnosis not present

## 2015-10-26 DIAGNOSIS — F17210 Nicotine dependence, cigarettes, uncomplicated: Secondary | ICD-10-CM | POA: Diagnosis not present

## 2015-10-27 DIAGNOSIS — M79662 Pain in left lower leg: Secondary | ICD-10-CM | POA: Diagnosis not present

## 2015-10-27 DIAGNOSIS — M79605 Pain in left leg: Secondary | ICD-10-CM | POA: Diagnosis not present

## 2015-10-27 DIAGNOSIS — Z4801 Encounter for change or removal of surgical wound dressing: Secondary | ICD-10-CM | POA: Diagnosis not present

## 2015-10-27 DIAGNOSIS — T8189XA Other complications of procedures, not elsewhere classified, initial encounter: Secondary | ICD-10-CM | POA: Diagnosis not present

## 2015-10-27 DIAGNOSIS — G8918 Other acute postprocedural pain: Secondary | ICD-10-CM | POA: Diagnosis not present

## 2015-10-27 DIAGNOSIS — M25572 Pain in left ankle and joints of left foot: Secondary | ICD-10-CM | POA: Diagnosis not present

## 2015-11-07 DIAGNOSIS — Z4801 Encounter for change or removal of surgical wound dressing: Secondary | ICD-10-CM | POA: Diagnosis not present

## 2015-11-07 DIAGNOSIS — T310 Burns involving less than 10% of body surface: Secondary | ICD-10-CM | POA: Diagnosis not present

## 2015-11-07 DIAGNOSIS — T24032A Burn of unspecified degree of left lower leg, initial encounter: Secondary | ICD-10-CM | POA: Diagnosis not present

## 2015-11-07 DIAGNOSIS — R52 Pain, unspecified: Secondary | ICD-10-CM | POA: Diagnosis not present

## 2015-11-07 DIAGNOSIS — M79605 Pain in left leg: Secondary | ICD-10-CM | POA: Diagnosis not present

## 2015-11-09 DIAGNOSIS — S81802D Unspecified open wound, left lower leg, subsequent encounter: Secondary | ICD-10-CM | POA: Diagnosis not present

## 2015-11-14 DIAGNOSIS — Z888 Allergy status to other drugs, medicaments and biological substances status: Secondary | ICD-10-CM | POA: Diagnosis not present

## 2015-11-14 DIAGNOSIS — D649 Anemia, unspecified: Secondary | ICD-10-CM | POA: Diagnosis not present

## 2015-11-14 DIAGNOSIS — K5903 Drug induced constipation: Secondary | ICD-10-CM | POA: Diagnosis not present

## 2015-11-14 DIAGNOSIS — K5900 Constipation, unspecified: Secondary | ICD-10-CM | POA: Diagnosis not present

## 2015-11-14 DIAGNOSIS — T25312A Burn of third degree of left ankle, initial encounter: Secondary | ICD-10-CM | POA: Diagnosis not present

## 2015-11-14 DIAGNOSIS — I10 Essential (primary) hypertension: Secondary | ICD-10-CM | POA: Diagnosis not present

## 2015-11-14 DIAGNOSIS — J45909 Unspecified asthma, uncomplicated: Secondary | ICD-10-CM | POA: Diagnosis not present

## 2015-11-14 DIAGNOSIS — Z9119 Patient's noncompliance with other medical treatment and regimen: Secondary | ICD-10-CM | POA: Diagnosis not present

## 2015-11-14 DIAGNOSIS — E119 Type 2 diabetes mellitus without complications: Secondary | ICD-10-CM | POA: Diagnosis not present

## 2015-11-14 DIAGNOSIS — K219 Gastro-esophageal reflux disease without esophagitis: Secondary | ICD-10-CM | POA: Diagnosis not present

## 2015-11-14 DIAGNOSIS — K921 Melena: Secondary | ICD-10-CM | POA: Diagnosis not present

## 2015-11-14 DIAGNOSIS — R6889 Other general symptoms and signs: Secondary | ICD-10-CM | POA: Diagnosis not present

## 2015-11-14 DIAGNOSIS — Z8719 Personal history of other diseases of the digestive system: Secondary | ICD-10-CM | POA: Diagnosis not present

## 2015-11-14 DIAGNOSIS — G40909 Epilepsy, unspecified, not intractable, without status epilepticus: Secondary | ICD-10-CM | POA: Diagnosis not present

## 2015-11-14 DIAGNOSIS — G43401 Hemiplegic migraine, not intractable, with status migrainosus: Secondary | ICD-10-CM | POA: Diagnosis not present

## 2015-11-14 DIAGNOSIS — F17210 Nicotine dependence, cigarettes, uncomplicated: Secondary | ICD-10-CM | POA: Diagnosis not present

## 2015-11-14 DIAGNOSIS — G8911 Acute pain due to trauma: Secondary | ICD-10-CM | POA: Diagnosis not present

## 2015-11-14 DIAGNOSIS — S81802D Unspecified open wound, left lower leg, subsequent encounter: Secondary | ICD-10-CM | POA: Diagnosis not present

## 2015-11-14 DIAGNOSIS — J329 Chronic sinusitis, unspecified: Secondary | ICD-10-CM | POA: Diagnosis not present

## 2015-11-16 DIAGNOSIS — T24392A Burn of third degree of multiple sites of left lower limb, except ankle and foot, initial encounter: Secondary | ICD-10-CM | POA: Diagnosis not present

## 2015-11-16 DIAGNOSIS — T25322D Burn of third degree of left foot, subsequent encounter: Secondary | ICD-10-CM | POA: Diagnosis not present

## 2015-11-16 DIAGNOSIS — G8911 Acute pain due to trauma: Secondary | ICD-10-CM | POA: Diagnosis not present

## 2015-11-16 DIAGNOSIS — G4733 Obstructive sleep apnea (adult) (pediatric): Secondary | ICD-10-CM | POA: Diagnosis not present

## 2015-11-16 DIAGNOSIS — F319 Bipolar disorder, unspecified: Secondary | ICD-10-CM | POA: Diagnosis not present

## 2015-11-16 DIAGNOSIS — M545 Low back pain: Secondary | ICD-10-CM | POA: Diagnosis not present

## 2015-11-16 DIAGNOSIS — T310 Burns involving less than 10% of body surface: Secondary | ICD-10-CM | POA: Diagnosis not present

## 2015-11-16 DIAGNOSIS — T25312D Burn of third degree of left ankle, subsequent encounter: Secondary | ICD-10-CM | POA: Diagnosis not present

## 2015-11-16 DIAGNOSIS — T24302D Burn of third degree of unspecified site of left lower limb, except ankle and foot, subsequent encounter: Secondary | ICD-10-CM | POA: Diagnosis not present

## 2015-11-16 DIAGNOSIS — G40909 Epilepsy, unspecified, not intractable, without status epilepticus: Secondary | ICD-10-CM | POA: Diagnosis not present

## 2015-11-16 DIAGNOSIS — J449 Chronic obstructive pulmonary disease, unspecified: Secondary | ICD-10-CM | POA: Diagnosis not present

## 2015-11-16 DIAGNOSIS — Z886 Allergy status to analgesic agent status: Secondary | ICD-10-CM | POA: Diagnosis not present

## 2015-11-16 DIAGNOSIS — G43409 Hemiplegic migraine, not intractable, without status migrainosus: Secondary | ICD-10-CM | POA: Diagnosis not present

## 2015-11-16 DIAGNOSIS — J45909 Unspecified asthma, uncomplicated: Secondary | ICD-10-CM | POA: Diagnosis not present

## 2015-11-16 DIAGNOSIS — M5136 Other intervertebral disc degeneration, lumbar region: Secondary | ICD-10-CM | POA: Diagnosis not present

## 2015-11-16 DIAGNOSIS — E119 Type 2 diabetes mellitus without complications: Secondary | ICD-10-CM | POA: Diagnosis not present

## 2015-11-16 DIAGNOSIS — E669 Obesity, unspecified: Secondary | ICD-10-CM | POA: Diagnosis not present

## 2015-11-16 DIAGNOSIS — F17210 Nicotine dependence, cigarettes, uncomplicated: Secondary | ICD-10-CM | POA: Diagnosis not present

## 2015-11-16 DIAGNOSIS — I10 Essential (primary) hypertension: Secondary | ICD-10-CM | POA: Diagnosis not present

## 2015-11-16 DIAGNOSIS — Z6834 Body mass index (BMI) 34.0-34.9, adult: Secondary | ICD-10-CM | POA: Diagnosis not present

## 2015-11-16 DIAGNOSIS — K219 Gastro-esophageal reflux disease without esophagitis: Secondary | ICD-10-CM | POA: Diagnosis not present

## 2015-11-16 DIAGNOSIS — Z880 Allergy status to penicillin: Secondary | ICD-10-CM | POA: Diagnosis not present

## 2015-11-16 DIAGNOSIS — J329 Chronic sinusitis, unspecified: Secondary | ICD-10-CM | POA: Diagnosis not present

## 2015-11-22 DIAGNOSIS — T310 Burns involving less than 10% of body surface: Secondary | ICD-10-CM | POA: Diagnosis not present

## 2015-11-22 DIAGNOSIS — Z881 Allergy status to other antibiotic agents status: Secondary | ICD-10-CM | POA: Diagnosis not present

## 2015-11-22 DIAGNOSIS — T25312D Burn of third degree of left ankle, subsequent encounter: Secondary | ICD-10-CM | POA: Diagnosis not present

## 2015-12-25 DIAGNOSIS — T310 Burns involving less than 10% of body surface: Secondary | ICD-10-CM | POA: Diagnosis not present

## 2015-12-25 DIAGNOSIS — T24002D Burn of unspecified degree of unspecified site of left lower limb, except ankle and foot, subsequent encounter: Secondary | ICD-10-CM | POA: Diagnosis not present

## 2016-01-11 DIAGNOSIS — F411 Generalized anxiety disorder: Secondary | ICD-10-CM | POA: Diagnosis not present

## 2016-01-11 DIAGNOSIS — G40509 Epileptic seizures related to external causes, not intractable, without status epilepticus: Secondary | ICD-10-CM | POA: Diagnosis not present

## 2016-01-11 DIAGNOSIS — E039 Hypothyroidism, unspecified: Secondary | ICD-10-CM | POA: Diagnosis not present

## 2016-01-11 DIAGNOSIS — E785 Hyperlipidemia, unspecified: Secondary | ICD-10-CM | POA: Diagnosis not present

## 2016-01-11 DIAGNOSIS — E119 Type 2 diabetes mellitus without complications: Secondary | ICD-10-CM | POA: Diagnosis not present

## 2016-01-11 DIAGNOSIS — E1165 Type 2 diabetes mellitus with hyperglycemia: Secondary | ICD-10-CM | POA: Diagnosis not present

## 2016-01-11 DIAGNOSIS — E782 Mixed hyperlipidemia: Secondary | ICD-10-CM | POA: Diagnosis not present

## 2016-01-11 DIAGNOSIS — E559 Vitamin D deficiency, unspecified: Secondary | ICD-10-CM | POA: Diagnosis not present

## 2016-01-11 DIAGNOSIS — I10 Essential (primary) hypertension: Secondary | ICD-10-CM | POA: Diagnosis not present

## 2016-01-11 DIAGNOSIS — E790 Hyperuricemia without signs of inflammatory arthritis and tophaceous disease: Secondary | ICD-10-CM | POA: Diagnosis not present

## 2016-01-11 DIAGNOSIS — R262 Difficulty in walking, not elsewhere classified: Secondary | ICD-10-CM | POA: Diagnosis not present

## 2016-02-06 DIAGNOSIS — F332 Major depressive disorder, recurrent severe without psychotic features: Secondary | ICD-10-CM | POA: Diagnosis not present

## 2016-02-19 DIAGNOSIS — Z886 Allergy status to analgesic agent status: Secondary | ICD-10-CM | POA: Diagnosis not present

## 2016-02-19 DIAGNOSIS — Z880 Allergy status to penicillin: Secondary | ICD-10-CM | POA: Diagnosis not present

## 2016-02-19 DIAGNOSIS — G8911 Acute pain due to trauma: Secondary | ICD-10-CM | POA: Diagnosis not present

## 2016-02-19 DIAGNOSIS — E119 Type 2 diabetes mellitus without complications: Secondary | ICD-10-CM | POA: Diagnosis not present

## 2016-02-19 DIAGNOSIS — T25312D Burn of third degree of left ankle, subsequent encounter: Secondary | ICD-10-CM | POA: Diagnosis not present

## 2016-02-19 DIAGNOSIS — T310 Burns involving less than 10% of body surface: Secondary | ICD-10-CM | POA: Diagnosis not present

## 2016-02-19 DIAGNOSIS — Z79899 Other long term (current) drug therapy: Secondary | ICD-10-CM | POA: Diagnosis not present

## 2016-02-19 DIAGNOSIS — G40909 Epilepsy, unspecified, not intractable, without status epilepticus: Secondary | ICD-10-CM | POA: Diagnosis not present

## 2016-02-19 DIAGNOSIS — Z881 Allergy status to other antibiotic agents status: Secondary | ICD-10-CM | POA: Diagnosis not present

## 2016-02-19 DIAGNOSIS — J449 Chronic obstructive pulmonary disease, unspecified: Secondary | ICD-10-CM | POA: Diagnosis not present

## 2016-02-19 DIAGNOSIS — I10 Essential (primary) hypertension: Secondary | ICD-10-CM | POA: Diagnosis not present

## 2016-02-19 DIAGNOSIS — Z888 Allergy status to other drugs, medicaments and biological substances status: Secondary | ICD-10-CM | POA: Diagnosis not present

## 2016-02-19 DIAGNOSIS — F17200 Nicotine dependence, unspecified, uncomplicated: Secondary | ICD-10-CM | POA: Diagnosis not present

## 2016-02-19 DIAGNOSIS — Z7951 Long term (current) use of inhaled steroids: Secondary | ICD-10-CM | POA: Diagnosis not present

## 2016-03-04 DIAGNOSIS — G4459 Other complicated headache syndrome: Secondary | ICD-10-CM | POA: Diagnosis not present

## 2016-03-04 DIAGNOSIS — F445 Conversion disorder with seizures or convulsions: Secondary | ICD-10-CM | POA: Diagnosis not present

## 2016-03-04 DIAGNOSIS — G40309 Generalized idiopathic epilepsy and epileptic syndromes, not intractable, without status epilepticus: Secondary | ICD-10-CM | POA: Diagnosis not present

## 2016-03-18 DIAGNOSIS — F332 Major depressive disorder, recurrent severe without psychotic features: Secondary | ICD-10-CM | POA: Diagnosis not present

## 2016-03-29 DIAGNOSIS — M5416 Radiculopathy, lumbar region: Secondary | ICD-10-CM | POA: Diagnosis not present

## 2016-03-29 DIAGNOSIS — M5136 Other intervertebral disc degeneration, lumbar region: Secondary | ICD-10-CM | POA: Diagnosis not present

## 2016-03-29 DIAGNOSIS — F329 Major depressive disorder, single episode, unspecified: Secondary | ICD-10-CM | POA: Diagnosis not present

## 2016-03-29 DIAGNOSIS — G894 Chronic pain syndrome: Secondary | ICD-10-CM | POA: Diagnosis not present

## 2016-04-08 DIAGNOSIS — F332 Major depressive disorder, recurrent severe without psychotic features: Secondary | ICD-10-CM | POA: Diagnosis not present

## 2016-04-10 DIAGNOSIS — E039 Hypothyroidism, unspecified: Secondary | ICD-10-CM | POA: Diagnosis not present

## 2016-04-10 DIAGNOSIS — E6609 Other obesity due to excess calories: Secondary | ICD-10-CM | POA: Diagnosis not present

## 2016-04-10 DIAGNOSIS — E119 Type 2 diabetes mellitus without complications: Secondary | ICD-10-CM | POA: Diagnosis not present

## 2016-04-10 DIAGNOSIS — E559 Vitamin D deficiency, unspecified: Secondary | ICD-10-CM | POA: Diagnosis not present

## 2016-04-10 DIAGNOSIS — E782 Mixed hyperlipidemia: Secondary | ICD-10-CM | POA: Diagnosis not present

## 2016-04-10 DIAGNOSIS — F411 Generalized anxiety disorder: Secondary | ICD-10-CM | POA: Diagnosis not present

## 2016-04-10 DIAGNOSIS — I10 Essential (primary) hypertension: Secondary | ICD-10-CM | POA: Diagnosis not present

## 2016-04-10 DIAGNOSIS — E790 Hyperuricemia without signs of inflammatory arthritis and tophaceous disease: Secondary | ICD-10-CM | POA: Diagnosis not present

## 2016-04-10 DIAGNOSIS — E1165 Type 2 diabetes mellitus with hyperglycemia: Secondary | ICD-10-CM | POA: Diagnosis not present

## 2016-04-23 DIAGNOSIS — Z888 Allergy status to other drugs, medicaments and biological substances status: Secondary | ICD-10-CM | POA: Diagnosis not present

## 2016-04-23 DIAGNOSIS — R069 Unspecified abnormalities of breathing: Secondary | ICD-10-CM | POA: Diagnosis not present

## 2016-04-23 DIAGNOSIS — F418 Other specified anxiety disorders: Secondary | ICD-10-CM | POA: Diagnosis present

## 2016-04-23 DIAGNOSIS — J189 Pneumonia, unspecified organism: Secondary | ICD-10-CM | POA: Diagnosis not present

## 2016-04-23 DIAGNOSIS — I10 Essential (primary) hypertension: Secondary | ICD-10-CM | POA: Diagnosis present

## 2016-04-23 DIAGNOSIS — R739 Hyperglycemia, unspecified: Secondary | ICD-10-CM | POA: Diagnosis present

## 2016-04-23 DIAGNOSIS — J9600 Acute respiratory failure, unspecified whether with hypoxia or hypercapnia: Secondary | ICD-10-CM | POA: Diagnosis not present

## 2016-04-23 DIAGNOSIS — R079 Chest pain, unspecified: Secondary | ICD-10-CM | POA: Diagnosis not present

## 2016-04-23 DIAGNOSIS — Z8673 Personal history of transient ischemic attack (TIA), and cerebral infarction without residual deficits: Secondary | ICD-10-CM | POA: Diagnosis not present

## 2016-04-23 DIAGNOSIS — R0600 Dyspnea, unspecified: Secondary | ICD-10-CM | POA: Diagnosis not present

## 2016-04-23 DIAGNOSIS — Z881 Allergy status to other antibiotic agents status: Secondary | ICD-10-CM | POA: Diagnosis not present

## 2016-04-23 DIAGNOSIS — J45909 Unspecified asthma, uncomplicated: Secondary | ICD-10-CM | POA: Diagnosis not present

## 2016-04-23 DIAGNOSIS — Z79899 Other long term (current) drug therapy: Secondary | ICD-10-CM | POA: Diagnosis not present

## 2016-04-23 DIAGNOSIS — R55 Syncope and collapse: Secondary | ICD-10-CM | POA: Diagnosis not present

## 2016-04-23 DIAGNOSIS — J4540 Moderate persistent asthma, uncomplicated: Secondary | ICD-10-CM | POA: Diagnosis present

## 2016-04-23 DIAGNOSIS — G40909 Epilepsy, unspecified, not intractable, without status epilepticus: Secondary | ICD-10-CM | POA: Diagnosis present

## 2016-04-23 DIAGNOSIS — Z880 Allergy status to penicillin: Secondary | ICD-10-CM | POA: Diagnosis not present

## 2016-04-23 DIAGNOSIS — G4733 Obstructive sleep apnea (adult) (pediatric): Secondary | ICD-10-CM | POA: Diagnosis not present

## 2016-04-23 DIAGNOSIS — J9620 Acute and chronic respiratory failure, unspecified whether with hypoxia or hypercapnia: Secondary | ICD-10-CM | POA: Diagnosis not present

## 2016-04-23 DIAGNOSIS — J449 Chronic obstructive pulmonary disease, unspecified: Secondary | ICD-10-CM | POA: Diagnosis present

## 2016-04-23 DIAGNOSIS — J9621 Acute and chronic respiratory failure with hypoxia: Secondary | ICD-10-CM | POA: Diagnosis present

## 2016-04-23 DIAGNOSIS — J45901 Unspecified asthma with (acute) exacerbation: Secondary | ICD-10-CM | POA: Diagnosis not present

## 2016-04-23 DIAGNOSIS — F17210 Nicotine dependence, cigarettes, uncomplicated: Secondary | ICD-10-CM | POA: Diagnosis present

## 2016-04-23 DIAGNOSIS — Z6831 Body mass index (BMI) 31.0-31.9, adult: Secondary | ICD-10-CM | POA: Diagnosis not present

## 2016-04-23 DIAGNOSIS — K219 Gastro-esophageal reflux disease without esophagitis: Secondary | ICD-10-CM | POA: Diagnosis present

## 2016-05-28 DIAGNOSIS — J4541 Moderate persistent asthma with (acute) exacerbation: Secondary | ICD-10-CM | POA: Diagnosis not present

## 2016-05-28 DIAGNOSIS — F411 Generalized anxiety disorder: Secondary | ICD-10-CM | POA: Diagnosis not present

## 2016-05-28 DIAGNOSIS — R5383 Other fatigue: Secondary | ICD-10-CM | POA: Diagnosis not present

## 2016-05-28 DIAGNOSIS — J301 Allergic rhinitis due to pollen: Secondary | ICD-10-CM | POA: Diagnosis not present

## 2016-05-30 DIAGNOSIS — F332 Major depressive disorder, recurrent severe without psychotic features: Secondary | ICD-10-CM | POA: Diagnosis not present

## 2016-06-15 DIAGNOSIS — R000 Tachycardia, unspecified: Secondary | ICD-10-CM | POA: Diagnosis not present

## 2016-06-15 DIAGNOSIS — R531 Weakness: Secondary | ICD-10-CM | POA: Diagnosis not present

## 2016-06-15 DIAGNOSIS — R569 Unspecified convulsions: Secondary | ICD-10-CM | POA: Diagnosis not present

## 2016-06-15 DIAGNOSIS — Z8673 Personal history of transient ischemic attack (TIA), and cerebral infarction without residual deficits: Secondary | ICD-10-CM | POA: Diagnosis not present

## 2016-06-15 DIAGNOSIS — M545 Low back pain: Secondary | ICD-10-CM | POA: Diagnosis not present

## 2016-06-15 DIAGNOSIS — R9431 Abnormal electrocardiogram [ECG] [EKG]: Secondary | ICD-10-CM | POA: Diagnosis not present

## 2016-06-15 DIAGNOSIS — F4542 Pain disorder with related psychological factors: Secondary | ICD-10-CM | POA: Diagnosis not present

## 2016-06-15 DIAGNOSIS — Z881 Allergy status to other antibiotic agents status: Secondary | ICD-10-CM | POA: Diagnosis not present

## 2016-06-15 DIAGNOSIS — I10 Essential (primary) hypertension: Secondary | ICD-10-CM | POA: Diagnosis not present

## 2016-06-15 DIAGNOSIS — F17210 Nicotine dependence, cigarettes, uncomplicated: Secondary | ICD-10-CM | POA: Diagnosis not present

## 2016-06-15 DIAGNOSIS — Z886 Allergy status to analgesic agent status: Secondary | ICD-10-CM | POA: Diagnosis not present

## 2016-06-15 DIAGNOSIS — R0789 Other chest pain: Secondary | ICD-10-CM | POA: Diagnosis not present

## 2016-06-15 DIAGNOSIS — Z888 Allergy status to other drugs, medicaments and biological substances status: Secondary | ICD-10-CM | POA: Diagnosis not present

## 2016-06-15 DIAGNOSIS — Z87892 Personal history of anaphylaxis: Secondary | ICD-10-CM | POA: Diagnosis not present

## 2016-06-15 DIAGNOSIS — M5489 Other dorsalgia: Secondary | ICD-10-CM | POA: Diagnosis not present

## 2016-06-15 DIAGNOSIS — Z79899 Other long term (current) drug therapy: Secondary | ICD-10-CM | POA: Diagnosis not present

## 2016-06-15 DIAGNOSIS — J449 Chronic obstructive pulmonary disease, unspecified: Secondary | ICD-10-CM | POA: Diagnosis not present

## 2016-06-15 DIAGNOSIS — G8929 Other chronic pain: Secondary | ICD-10-CM | POA: Diagnosis not present

## 2016-06-15 DIAGNOSIS — Z880 Allergy status to penicillin: Secondary | ICD-10-CM | POA: Diagnosis not present

## 2016-06-15 DIAGNOSIS — R079 Chest pain, unspecified: Secondary | ICD-10-CM | POA: Diagnosis not present

## 2016-06-15 DIAGNOSIS — Z7982 Long term (current) use of aspirin: Secondary | ICD-10-CM | POA: Diagnosis not present

## 2016-06-15 DIAGNOSIS — F419 Anxiety disorder, unspecified: Secondary | ICD-10-CM | POA: Diagnosis not present

## 2016-06-15 DIAGNOSIS — F319 Bipolar disorder, unspecified: Secondary | ICD-10-CM | POA: Diagnosis not present

## 2016-06-17 DIAGNOSIS — Z79899 Other long term (current) drug therapy: Secondary | ICD-10-CM | POA: Diagnosis not present

## 2016-06-17 DIAGNOSIS — R109 Unspecified abdominal pain: Secondary | ICD-10-CM | POA: Diagnosis not present

## 2016-06-17 DIAGNOSIS — F444 Conversion disorder with motor symptom or deficit: Secondary | ICD-10-CM | POA: Diagnosis not present

## 2016-06-17 DIAGNOSIS — E119 Type 2 diabetes mellitus without complications: Secondary | ICD-10-CM | POA: Diagnosis not present

## 2016-06-17 DIAGNOSIS — Z720 Tobacco use: Secondary | ICD-10-CM | POA: Diagnosis not present

## 2016-06-17 DIAGNOSIS — I10 Essential (primary) hypertension: Secondary | ICD-10-CM | POA: Diagnosis not present

## 2016-06-17 DIAGNOSIS — R569 Unspecified convulsions: Secondary | ICD-10-CM | POA: Diagnosis not present

## 2016-06-17 DIAGNOSIS — J449 Chronic obstructive pulmonary disease, unspecified: Secondary | ICD-10-CM | POA: Diagnosis not present

## 2016-06-17 DIAGNOSIS — M545 Low back pain: Secondary | ICD-10-CM | POA: Diagnosis not present

## 2016-06-17 DIAGNOSIS — R1084 Generalized abdominal pain: Secondary | ICD-10-CM | POA: Diagnosis not present

## 2016-06-25 DIAGNOSIS — M791 Myalgia: Secondary | ICD-10-CM | POA: Diagnosis not present

## 2016-06-25 DIAGNOSIS — E119 Type 2 diabetes mellitus without complications: Secondary | ICD-10-CM | POA: Diagnosis not present

## 2016-06-25 DIAGNOSIS — M545 Low back pain: Secondary | ICD-10-CM | POA: Diagnosis not present

## 2016-06-25 DIAGNOSIS — I10 Essential (primary) hypertension: Secondary | ICD-10-CM | POA: Diagnosis not present

## 2016-07-10 DIAGNOSIS — E119 Type 2 diabetes mellitus without complications: Secondary | ICD-10-CM | POA: Diagnosis not present

## 2016-07-10 DIAGNOSIS — E6609 Other obesity due to excess calories: Secondary | ICD-10-CM | POA: Diagnosis not present

## 2016-07-10 DIAGNOSIS — E1165 Type 2 diabetes mellitus with hyperglycemia: Secondary | ICD-10-CM | POA: Diagnosis not present

## 2016-07-10 DIAGNOSIS — I10 Essential (primary) hypertension: Secondary | ICD-10-CM | POA: Diagnosis not present

## 2016-07-10 DIAGNOSIS — E039 Hypothyroidism, unspecified: Secondary | ICD-10-CM | POA: Diagnosis not present

## 2016-07-10 DIAGNOSIS — E790 Hyperuricemia without signs of inflammatory arthritis and tophaceous disease: Secondary | ICD-10-CM | POA: Diagnosis not present

## 2016-07-10 DIAGNOSIS — E785 Hyperlipidemia, unspecified: Secondary | ICD-10-CM | POA: Diagnosis not present

## 2016-07-10 DIAGNOSIS — F411 Generalized anxiety disorder: Secondary | ICD-10-CM | POA: Diagnosis not present

## 2016-07-10 DIAGNOSIS — E559 Vitamin D deficiency, unspecified: Secondary | ICD-10-CM | POA: Diagnosis not present

## 2016-07-10 DIAGNOSIS — E782 Mixed hyperlipidemia: Secondary | ICD-10-CM | POA: Diagnosis not present

## 2016-07-25 DIAGNOSIS — G894 Chronic pain syndrome: Secondary | ICD-10-CM | POA: Diagnosis not present

## 2016-07-25 DIAGNOSIS — M545 Low back pain: Secondary | ICD-10-CM | POA: Diagnosis not present

## 2016-07-25 DIAGNOSIS — M79605 Pain in left leg: Secondary | ICD-10-CM | POA: Diagnosis not present

## 2016-07-25 DIAGNOSIS — M79604 Pain in right leg: Secondary | ICD-10-CM | POA: Diagnosis not present

## 2016-07-26 DIAGNOSIS — M5416 Radiculopathy, lumbar region: Secondary | ICD-10-CM | POA: Diagnosis not present

## 2016-07-26 DIAGNOSIS — M5030 Other cervical disc degeneration, unspecified cervical region: Secondary | ICD-10-CM | POA: Diagnosis not present

## 2016-07-26 DIAGNOSIS — M5442 Lumbago with sciatica, left side: Secondary | ICD-10-CM | POA: Diagnosis not present

## 2016-07-26 DIAGNOSIS — G894 Chronic pain syndrome: Secondary | ICD-10-CM | POA: Diagnosis not present

## 2016-08-30 DIAGNOSIS — G894 Chronic pain syndrome: Secondary | ICD-10-CM | POA: Diagnosis not present

## 2016-08-30 DIAGNOSIS — M5136 Other intervertebral disc degeneration, lumbar region: Secondary | ICD-10-CM | POA: Diagnosis not present

## 2016-08-30 DIAGNOSIS — M5442 Lumbago with sciatica, left side: Secondary | ICD-10-CM | POA: Diagnosis not present

## 2016-08-30 DIAGNOSIS — M5416 Radiculopathy, lumbar region: Secondary | ICD-10-CM | POA: Diagnosis not present

## 2016-10-01 DIAGNOSIS — G894 Chronic pain syndrome: Secondary | ICD-10-CM | POA: Diagnosis not present

## 2016-10-07 DIAGNOSIS — I10 Essential (primary) hypertension: Secondary | ICD-10-CM | POA: Diagnosis not present

## 2016-10-07 DIAGNOSIS — F411 Generalized anxiety disorder: Secondary | ICD-10-CM | POA: Diagnosis not present

## 2016-10-07 DIAGNOSIS — E119 Type 2 diabetes mellitus without complications: Secondary | ICD-10-CM | POA: Diagnosis not present

## 2016-10-07 DIAGNOSIS — M545 Low back pain: Secondary | ICD-10-CM | POA: Diagnosis not present

## 2016-10-24 DIAGNOSIS — R000 Tachycardia, unspecified: Secondary | ICD-10-CM | POA: Diagnosis not present

## 2016-11-05 DIAGNOSIS — G8911 Acute pain due to trauma: Secondary | ICD-10-CM | POA: Diagnosis not present

## 2016-11-05 DIAGNOSIS — S86191A Other injury of other muscle(s) and tendon(s) of posterior muscle group at lower leg level, right leg, initial encounter: Secondary | ICD-10-CM | POA: Diagnosis not present

## 2016-12-09 DIAGNOSIS — F332 Major depressive disorder, recurrent severe without psychotic features: Secondary | ICD-10-CM | POA: Diagnosis not present

## 2016-12-11 DIAGNOSIS — M5440 Lumbago with sciatica, unspecified side: Secondary | ICD-10-CM | POA: Diagnosis not present

## 2016-12-11 DIAGNOSIS — R569 Unspecified convulsions: Secondary | ICD-10-CM | POA: Diagnosis not present

## 2016-12-11 DIAGNOSIS — F17210 Nicotine dependence, cigarettes, uncomplicated: Secondary | ICD-10-CM | POA: Diagnosis not present

## 2016-12-11 DIAGNOSIS — G8929 Other chronic pain: Secondary | ICD-10-CM | POA: Diagnosis not present

## 2016-12-11 DIAGNOSIS — R0789 Other chest pain: Secondary | ICD-10-CM | POA: Diagnosis not present

## 2016-12-11 DIAGNOSIS — R079 Chest pain, unspecified: Secondary | ICD-10-CM | POA: Diagnosis not present

## 2016-12-12 DIAGNOSIS — R0789 Other chest pain: Secondary | ICD-10-CM | POA: Diagnosis not present

## 2017-01-02 DIAGNOSIS — E669 Obesity, unspecified: Secondary | ICD-10-CM | POA: Diagnosis not present

## 2017-01-02 DIAGNOSIS — I10 Essential (primary) hypertension: Secondary | ICD-10-CM | POA: Diagnosis not present

## 2017-01-02 DIAGNOSIS — E119 Type 2 diabetes mellitus without complications: Secondary | ICD-10-CM | POA: Diagnosis not present

## 2017-01-02 DIAGNOSIS — F17210 Nicotine dependence, cigarettes, uncomplicated: Secondary | ICD-10-CM | POA: Diagnosis not present

## 2017-01-02 DIAGNOSIS — F411 Generalized anxiety disorder: Secondary | ICD-10-CM | POA: Diagnosis not present

## 2017-01-28 DIAGNOSIS — E119 Type 2 diabetes mellitus without complications: Secondary | ICD-10-CM | POA: Diagnosis not present

## 2017-01-28 DIAGNOSIS — Z888 Allergy status to other drugs, medicaments and biological substances status: Secondary | ICD-10-CM | POA: Diagnosis not present

## 2017-01-28 DIAGNOSIS — M5136 Other intervertebral disc degeneration, lumbar region: Secondary | ICD-10-CM | POA: Diagnosis not present

## 2017-01-28 DIAGNOSIS — M545 Low back pain: Secondary | ICD-10-CM | POA: Diagnosis not present

## 2017-01-28 DIAGNOSIS — G8929 Other chronic pain: Secondary | ICD-10-CM | POA: Diagnosis not present

## 2017-01-28 DIAGNOSIS — F17210 Nicotine dependence, cigarettes, uncomplicated: Secondary | ICD-10-CM | POA: Diagnosis not present

## 2017-01-28 DIAGNOSIS — M5416 Radiculopathy, lumbar region: Secondary | ICD-10-CM | POA: Diagnosis not present

## 2017-01-28 DIAGNOSIS — E669 Obesity, unspecified: Secondary | ICD-10-CM | POA: Diagnosis not present

## 2017-01-28 DIAGNOSIS — J449 Chronic obstructive pulmonary disease, unspecified: Secondary | ICD-10-CM | POA: Diagnosis not present

## 2017-01-28 DIAGNOSIS — J45909 Unspecified asthma, uncomplicated: Secondary | ICD-10-CM | POA: Diagnosis not present

## 2017-01-28 DIAGNOSIS — Z462 Encounter for fitting and adjustment of other devices related to nervous system and special senses: Secondary | ICD-10-CM | POA: Diagnosis not present

## 2017-01-28 DIAGNOSIS — F329 Major depressive disorder, single episode, unspecified: Secondary | ICD-10-CM | POA: Diagnosis not present

## 2017-01-28 DIAGNOSIS — K219 Gastro-esophageal reflux disease without esophagitis: Secondary | ICD-10-CM | POA: Diagnosis not present

## 2017-01-28 DIAGNOSIS — Z8673 Personal history of transient ischemic attack (TIA), and cerebral infarction without residual deficits: Secondary | ICD-10-CM | POA: Diagnosis not present

## 2017-01-28 DIAGNOSIS — M961 Postlaminectomy syndrome, not elsewhere classified: Secondary | ICD-10-CM | POA: Diagnosis not present

## 2017-01-28 DIAGNOSIS — G4733 Obstructive sleep apnea (adult) (pediatric): Secondary | ICD-10-CM | POA: Diagnosis not present

## 2017-01-28 DIAGNOSIS — Z880 Allergy status to penicillin: Secondary | ICD-10-CM | POA: Diagnosis not present

## 2017-01-28 DIAGNOSIS — F419 Anxiety disorder, unspecified: Secondary | ICD-10-CM | POA: Diagnosis not present

## 2017-01-28 DIAGNOSIS — I10 Essential (primary) hypertension: Secondary | ICD-10-CM | POA: Diagnosis not present

## 2017-01-28 DIAGNOSIS — R569 Unspecified convulsions: Secondary | ICD-10-CM | POA: Diagnosis not present

## 2017-01-28 DIAGNOSIS — M1990 Unspecified osteoarthritis, unspecified site: Secondary | ICD-10-CM | POA: Diagnosis not present

## 2017-01-28 DIAGNOSIS — Z6833 Body mass index (BMI) 33.0-33.9, adult: Secondary | ICD-10-CM | POA: Diagnosis not present

## 2017-02-26 DIAGNOSIS — F332 Major depressive disorder, recurrent severe without psychotic features: Secondary | ICD-10-CM | POA: Diagnosis not present

## 2017-03-19 DIAGNOSIS — Z8719 Personal history of other diseases of the digestive system: Secondary | ICD-10-CM | POA: Diagnosis not present

## 2017-03-19 DIAGNOSIS — J449 Chronic obstructive pulmonary disease, unspecified: Secondary | ICD-10-CM | POA: Diagnosis not present

## 2017-03-19 DIAGNOSIS — Z79899 Other long term (current) drug therapy: Secondary | ICD-10-CM | POA: Diagnosis not present

## 2017-03-19 DIAGNOSIS — R1031 Right lower quadrant pain: Secondary | ICD-10-CM | POA: Diagnosis not present

## 2017-03-19 DIAGNOSIS — K219 Gastro-esophageal reflux disease without esophagitis: Secondary | ICD-10-CM | POA: Diagnosis not present

## 2017-03-19 DIAGNOSIS — Z8673 Personal history of transient ischemic attack (TIA), and cerebral infarction without residual deficits: Secondary | ICD-10-CM | POA: Diagnosis not present

## 2017-03-19 DIAGNOSIS — R1033 Periumbilical pain: Secondary | ICD-10-CM | POA: Diagnosis not present

## 2017-03-19 DIAGNOSIS — I10 Essential (primary) hypertension: Secondary | ICD-10-CM | POA: Diagnosis not present

## 2017-03-19 DIAGNOSIS — K760 Fatty (change of) liver, not elsewhere classified: Secondary | ICD-10-CM | POA: Diagnosis not present

## 2017-03-19 DIAGNOSIS — F17200 Nicotine dependence, unspecified, uncomplicated: Secondary | ICD-10-CM | POA: Diagnosis not present

## 2017-03-28 DIAGNOSIS — K921 Melena: Secondary | ICD-10-CM | POA: Diagnosis not present

## 2017-03-28 DIAGNOSIS — R1084 Generalized abdominal pain: Secondary | ICD-10-CM | POA: Diagnosis not present

## 2017-03-28 DIAGNOSIS — M549 Dorsalgia, unspecified: Secondary | ICD-10-CM | POA: Diagnosis not present

## 2017-03-28 DIAGNOSIS — R1031 Right lower quadrant pain: Secondary | ICD-10-CM | POA: Diagnosis not present

## 2017-04-07 DIAGNOSIS — T8189XA Other complications of procedures, not elsewhere classified, initial encounter: Secondary | ICD-10-CM | POA: Diagnosis not present

## 2017-04-07 DIAGNOSIS — J9690 Respiratory failure, unspecified, unspecified whether with hypoxia or hypercapnia: Secondary | ICD-10-CM | POA: Diagnosis not present

## 2017-04-07 DIAGNOSIS — J9620 Acute and chronic respiratory failure, unspecified whether with hypoxia or hypercapnia: Secondary | ICD-10-CM | POA: Diagnosis not present

## 2017-04-07 DIAGNOSIS — J310 Chronic rhinitis: Secondary | ICD-10-CM | POA: Diagnosis not present

## 2017-04-07 DIAGNOSIS — J4540 Moderate persistent asthma, uncomplicated: Secondary | ICD-10-CM | POA: Diagnosis not present

## 2017-04-07 DIAGNOSIS — R0602 Shortness of breath: Secondary | ICD-10-CM | POA: Diagnosis not present

## 2017-04-07 DIAGNOSIS — M546 Pain in thoracic spine: Secondary | ICD-10-CM | POA: Diagnosis not present

## 2017-04-08 DIAGNOSIS — Z743 Need for continuous supervision: Secondary | ICD-10-CM | POA: Diagnosis not present

## 2017-04-08 DIAGNOSIS — Z8669 Personal history of other diseases of the nervous system and sense organs: Secondary | ICD-10-CM | POA: Diagnosis not present

## 2017-04-08 DIAGNOSIS — J4540 Moderate persistent asthma, uncomplicated: Secondary | ICD-10-CM | POA: Diagnosis not present

## 2017-04-08 DIAGNOSIS — Z79899 Other long term (current) drug therapy: Secondary | ICD-10-CM | POA: Diagnosis not present

## 2017-04-08 DIAGNOSIS — Z9981 Dependence on supplemental oxygen: Secondary | ICD-10-CM | POA: Diagnosis not present

## 2017-04-08 DIAGNOSIS — J441 Chronic obstructive pulmonary disease with (acute) exacerbation: Secondary | ICD-10-CM | POA: Diagnosis not present

## 2017-04-08 DIAGNOSIS — Z7952 Long term (current) use of systemic steroids: Secondary | ICD-10-CM | POA: Diagnosis not present

## 2017-04-08 DIAGNOSIS — K219 Gastro-esophageal reflux disease without esophagitis: Secondary | ICD-10-CM | POA: Diagnosis present

## 2017-04-08 DIAGNOSIS — J45901 Unspecified asthma with (acute) exacerbation: Secondary | ICD-10-CM | POA: Diagnosis not present

## 2017-04-08 DIAGNOSIS — J4541 Moderate persistent asthma with (acute) exacerbation: Secondary | ICD-10-CM | POA: Diagnosis present

## 2017-04-08 DIAGNOSIS — J9621 Acute and chronic respiratory failure with hypoxia: Secondary | ICD-10-CM | POA: Diagnosis present

## 2017-04-08 DIAGNOSIS — J9620 Acute and chronic respiratory failure, unspecified whether with hypoxia or hypercapnia: Secondary | ICD-10-CM | POA: Diagnosis not present

## 2017-04-08 DIAGNOSIS — R279 Unspecified lack of coordination: Secondary | ICD-10-CM | POA: Diagnosis not present

## 2017-04-08 DIAGNOSIS — R0602 Shortness of breath: Secondary | ICD-10-CM | POA: Diagnosis not present

## 2017-04-08 DIAGNOSIS — J9690 Respiratory failure, unspecified, unspecified whether with hypoxia or hypercapnia: Secondary | ICD-10-CM | POA: Diagnosis not present

## 2017-04-08 DIAGNOSIS — J310 Chronic rhinitis: Secondary | ICD-10-CM | POA: Diagnosis not present

## 2017-04-08 DIAGNOSIS — F17210 Nicotine dependence, cigarettes, uncomplicated: Secondary | ICD-10-CM | POA: Diagnosis present

## 2017-04-17 DIAGNOSIS — J45901 Unspecified asthma with (acute) exacerbation: Secondary | ICD-10-CM | POA: Diagnosis not present

## 2017-04-17 DIAGNOSIS — Z9189 Other specified personal risk factors, not elsewhere classified: Secondary | ICD-10-CM | POA: Diagnosis not present

## 2017-04-17 DIAGNOSIS — J9620 Acute and chronic respiratory failure, unspecified whether with hypoxia or hypercapnia: Secondary | ICD-10-CM | POA: Diagnosis not present

## 2017-04-17 DIAGNOSIS — Z9689 Presence of other specified functional implants: Secondary | ICD-10-CM | POA: Diagnosis not present

## 2017-05-07 DIAGNOSIS — G8929 Other chronic pain: Secondary | ICD-10-CM | POA: Diagnosis not present

## 2017-05-07 DIAGNOSIS — M5416 Radiculopathy, lumbar region: Secondary | ICD-10-CM | POA: Diagnosis not present

## 2017-05-07 DIAGNOSIS — M5442 Lumbago with sciatica, left side: Secondary | ICD-10-CM | POA: Diagnosis not present

## 2017-05-07 DIAGNOSIS — M5136 Other intervertebral disc degeneration, lumbar region: Secondary | ICD-10-CM | POA: Diagnosis not present

## 2017-05-07 DIAGNOSIS — G894 Chronic pain syndrome: Secondary | ICD-10-CM | POA: Diagnosis not present

## 2017-05-07 DIAGNOSIS — M5441 Lumbago with sciatica, right side: Secondary | ICD-10-CM | POA: Diagnosis not present

## 2017-06-04 DIAGNOSIS — G40309 Generalized idiopathic epilepsy and epileptic syndromes, not intractable, without status epilepticus: Secondary | ICD-10-CM | POA: Diagnosis not present

## 2017-06-04 DIAGNOSIS — J45901 Unspecified asthma with (acute) exacerbation: Secondary | ICD-10-CM | POA: Diagnosis not present

## 2017-06-04 DIAGNOSIS — Z6836 Body mass index (BMI) 36.0-36.9, adult: Secondary | ICD-10-CM | POA: Diagnosis not present

## 2017-06-10 DIAGNOSIS — F332 Major depressive disorder, recurrent severe without psychotic features: Secondary | ICD-10-CM | POA: Diagnosis not present

## 2017-06-11 DIAGNOSIS — G40309 Generalized idiopathic epilepsy and epileptic syndromes, not intractable, without status epilepticus: Secondary | ICD-10-CM | POA: Diagnosis not present

## 2017-06-11 DIAGNOSIS — J45909 Unspecified asthma, uncomplicated: Secondary | ICD-10-CM | POA: Diagnosis not present

## 2017-06-11 DIAGNOSIS — J339 Nasal polyp, unspecified: Secondary | ICD-10-CM | POA: Diagnosis not present

## 2017-06-11 DIAGNOSIS — H6690 Otitis media, unspecified, unspecified ear: Secondary | ICD-10-CM | POA: Diagnosis not present

## 2017-06-16 DIAGNOSIS — G40309 Generalized idiopathic epilepsy and epileptic syndromes, not intractable, without status epilepticus: Secondary | ICD-10-CM | POA: Diagnosis not present

## 2017-06-16 DIAGNOSIS — R569 Unspecified convulsions: Secondary | ICD-10-CM | POA: Diagnosis not present

## 2017-06-16 DIAGNOSIS — Z6835 Body mass index (BMI) 35.0-35.9, adult: Secondary | ICD-10-CM | POA: Diagnosis not present

## 2017-06-30 DIAGNOSIS — F319 Bipolar disorder, unspecified: Secondary | ICD-10-CM | POA: Diagnosis not present

## 2017-06-30 DIAGNOSIS — R569 Unspecified convulsions: Secondary | ICD-10-CM | POA: Diagnosis not present

## 2017-07-08 DIAGNOSIS — F332 Major depressive disorder, recurrent severe without psychotic features: Secondary | ICD-10-CM | POA: Diagnosis not present

## 2017-07-29 DIAGNOSIS — H6123 Impacted cerumen, bilateral: Secondary | ICD-10-CM | POA: Diagnosis not present

## 2017-07-29 DIAGNOSIS — Z9889 Other specified postprocedural states: Secondary | ICD-10-CM | POA: Diagnosis not present

## 2017-07-29 DIAGNOSIS — J329 Chronic sinusitis, unspecified: Secondary | ICD-10-CM | POA: Diagnosis not present

## 2017-07-29 DIAGNOSIS — Z87820 Personal history of traumatic brain injury: Secondary | ICD-10-CM | POA: Diagnosis not present

## 2017-08-04 DIAGNOSIS — G40309 Generalized idiopathic epilepsy and epileptic syndromes, not intractable, without status epilepticus: Secondary | ICD-10-CM | POA: Diagnosis not present

## 2017-09-03 DIAGNOSIS — G4733 Obstructive sleep apnea (adult) (pediatric): Secondary | ICD-10-CM | POA: Diagnosis not present

## 2017-09-03 DIAGNOSIS — J310 Chronic rhinitis: Secondary | ICD-10-CM | POA: Diagnosis not present

## 2017-09-03 DIAGNOSIS — J4541 Moderate persistent asthma with (acute) exacerbation: Secondary | ICD-10-CM | POA: Diagnosis not present

## 2017-09-03 DIAGNOSIS — R5383 Other fatigue: Secondary | ICD-10-CM | POA: Diagnosis not present

## 2017-09-03 DIAGNOSIS — F411 Generalized anxiety disorder: Secondary | ICD-10-CM | POA: Diagnosis not present

## 2017-09-03 DIAGNOSIS — F17210 Nicotine dependence, cigarettes, uncomplicated: Secondary | ICD-10-CM | POA: Diagnosis not present

## 2017-09-09 ENCOUNTER — Emergency Department (HOSPITAL_COMMUNITY): Payer: Medicare Other

## 2017-09-09 ENCOUNTER — Emergency Department (HOSPITAL_COMMUNITY)
Admission: EM | Admit: 2017-09-09 | Discharge: 2017-09-09 | Disposition: A | Discharge status: Home / Self Care | Payer: Medicare Other | Attending: Emergency Medicine | Admitting: Emergency Medicine

## 2017-09-09 ENCOUNTER — Encounter (HOSPITAL_COMMUNITY): Payer: Self-pay

## 2017-09-09 DIAGNOSIS — Z23 Encounter for immunization: Secondary | ICD-10-CM | POA: Diagnosis not present

## 2017-09-09 DIAGNOSIS — F17200 Nicotine dependence, unspecified, uncomplicated: Secondary | ICD-10-CM | POA: Diagnosis not present

## 2017-09-09 DIAGNOSIS — T148XXA Other injury of unspecified body region, initial encounter: Secondary | ICD-10-CM | POA: Diagnosis not present

## 2017-09-09 DIAGNOSIS — W228XXA Striking against or struck by other objects, initial encounter: Secondary | ICD-10-CM | POA: Insufficient documentation

## 2017-09-09 DIAGNOSIS — S81812A Laceration without foreign body, left lower leg, initial encounter: Secondary | ICD-10-CM | POA: Diagnosis not present

## 2017-09-09 DIAGNOSIS — Y999 Unspecified external cause status: Secondary | ICD-10-CM | POA: Insufficient documentation

## 2017-09-09 DIAGNOSIS — Y929 Unspecified place or not applicable: Secondary | ICD-10-CM | POA: Diagnosis not present

## 2017-09-09 DIAGNOSIS — S8992XA Unspecified injury of left lower leg, initial encounter: Secondary | ICD-10-CM | POA: Diagnosis present

## 2017-09-09 DIAGNOSIS — S71112A Laceration without foreign body, left thigh, initial encounter: Secondary | ICD-10-CM | POA: Diagnosis not present

## 2017-09-09 DIAGNOSIS — I10 Essential (primary) hypertension: Secondary | ICD-10-CM | POA: Insufficient documentation

## 2017-09-09 DIAGNOSIS — R52 Pain, unspecified: Secondary | ICD-10-CM

## 2017-09-09 DIAGNOSIS — Y9389 Activity, other specified: Secondary | ICD-10-CM | POA: Insufficient documentation

## 2017-09-09 HISTORY — DX: Essential (primary) hypertension: I10

## 2017-09-09 HISTORY — DX: Unspecified convulsions: R56.9

## 2017-09-09 LAB — BASIC METABOLIC PANEL
ANION GAP: 13 (ref 5–15)
BUN: 22 mg/dL — ABNORMAL HIGH (ref 6–20)
CHLORIDE: 103 mmol/L (ref 101–111)
CO2: 18 mmol/L — ABNORMAL LOW (ref 22–32)
CREATININE: 1.12 mg/dL (ref 0.61–1.24)
Calcium: 9.6 mg/dL (ref 8.9–10.3)
GFR calc non Af Amer: >60 mL/min (ref >60)
Glucose, Bld: 112 mg/dL — ABNORMAL HIGH (ref 65–99)
Potassium: 4.1 mmol/L (ref 3.5–5.1)
SODIUM: 134 mmol/L — AB (ref 135–145)

## 2017-09-09 LAB — CBC
HCT: 41.2 % (ref 39.0–52.0)
HEMOGLOBIN: 14.4 g/dL (ref 13.0–17.0)
MCH: 31.3 pg (ref 26.0–34.0)
MCHC: 35 g/dL (ref 30.0–36.0)
MCV: 89.6 fL (ref 78.0–100.0)
Platelets: 234 10*3/uL (ref 150–400)
RBC: 4.6 MIL/uL (ref 4.22–5.81)
RDW: 13.1 % (ref 11.5–15.5)
WBC: 11.7 10*3/uL — AB (ref 4.0–10.5)

## 2017-09-09 MED ORDER — TRAMADOL HCL 50 MG PO TABS: 50.0000 mg | ORAL_TABLET | Freq: Four times a day (QID) | ORAL | 0 refills | Status: DC | PRN

## 2017-09-09 MED ORDER — TETANUS-DIPHTH-ACELL PERTUSSIS 5-2.5-18.5 LF-MCG/0.5 IM SUSP
0.5000 mL | Freq: Once | INTRAMUSCULAR | Status: AC
  Administered 2017-09-09: 0.5 mL via INTRAMUSCULAR
  Filled 2017-09-09: qty 0.5

## 2017-09-09 MED ORDER — ONDANSETRON HCL 4 MG/2ML IJ SOLN
4.0000 mg | Freq: Once | INTRAMUSCULAR | Status: AC
  Administered 2017-09-09: 4 mg via INTRAVENOUS
  Filled 2017-09-09: qty 2

## 2017-09-09 MED ORDER — MORPHINE SULFATE (PF) 4 MG/ML IV SOLN
4.0000 mg | Freq: Once | INTRAVENOUS | Status: AC
  Administered 2017-09-09: 4 mg via INTRAVENOUS
  Filled 2017-09-09: qty 1

## 2017-09-09 MED ORDER — LIDOCAINE HCL (PF) 1 % IJ SOLN
30.0000 mL | Freq: Once | INTRAMUSCULAR | Status: AC
  Administered 2017-09-09: 30 mL via INTRADERMAL
  Filled 2017-09-09: qty 30

## 2017-09-09 MED ORDER — ONDANSETRON 4 MG PO TBDP
4.0000 mg | ORAL_TABLET | Freq: Once | ORAL | Status: AC
  Administered 2017-09-09: 4 mg via ORAL
  Filled 2017-09-09: qty 1

## 2017-09-09 MED ORDER — TRAMADOL HCL 50 MG PO TABS
50.0000 mg | ORAL_TABLET | Freq: Once | ORAL | Status: AC
Start: 2017-09-09 — End: 2017-09-09
  Administered 2017-09-09: 50 mg via ORAL
  Filled 2017-09-09: qty 1

## 2017-09-09 MED ORDER — CEPHALEXIN 500 MG PO CAPS: 500.0000 mg | ORAL_CAPSULE | Freq: Four times a day (QID) | ORAL | 0 refills | Status: DC

## 2017-09-09 MED ORDER — VANCOMYCIN HCL IN DEXTROSE 1-5 GM/200ML-% IV SOLN
1000.0000 mg | Freq: Once | INTRAVENOUS | Status: AC
  Administered 2017-09-09: 1000 mg via INTRAVENOUS
  Filled 2017-09-09: qty 200

## 2017-09-09 MED ORDER — SODIUM CHLORIDE 0.9 % IV BOLUS
1000.0000 mL | Freq: Once | INTRAVENOUS | Status: AC
  Administered 2017-09-09: 1000 mL via INTRAVENOUS

## 2017-09-09 MED ORDER — DOXYCYCLINE HYCLATE 100 MG PO CAPS: 100.0000 mg | ORAL_CAPSULE | Freq: Two times a day (BID) | ORAL | 0 refills | Status: AC

## 2017-09-09 MED ORDER — OXYCODONE-ACETAMINOPHEN 5-325 MG PO TABS: 1.0000 | ORAL_TABLET | Freq: Once | ORAL | Status: DC

## 2017-09-09 MED ORDER — LEVOFLOXACIN 500 MG PO TABS: 500.0000 mg | ORAL_TABLET | Freq: Every day | ORAL | 0 refills | Status: DC

## 2017-09-09 MED FILL — DOXYCYCLINE HYCLATE 100 MG: 100 | 7 days supply | Qty: 14 | Fill #0

## 2017-09-09 MED FILL — traMADol HCL 50 MG TABS: 50 | 4 days supply | Qty: 15 | Fill #0

## 2017-09-09 NOTE — ED Notes (Signed)
Got patient undress on the monitor patient is resting with call bell in reach 

## 2017-09-09 NOTE — ED Notes (Signed)
Case management at bedside.

## 2017-09-09 NOTE — Progress Notes (Signed)
Orthopedic Tech Progress Note Patient Details:  [name] 1976/04/10 [id number]  Ortho Devices Type of Ortho Device: Knee Immobilizer Ortho Device/Splint Location: LLE Ortho Device/Splint Interventions: Ordered, Application   Post Interventions Patient Tolerated: Well Instructions Provided: Care of device   [name] 09/09/2017, 3:38 PM

## 2017-09-09 NOTE — Consult Note (Signed)
Reason for Consult:Left thigh lac Referring Physician: M Mabe  [name] is an 42 y.o. male.  HPI: [name] was working with a chainsaw when it kicked back and cut his left thigh through his jeans. He had immediate pain and numbness distal to the laceration. He came to the ED for evaluation and orthopedic surgery was consulted.  Past Medical History:  Diagnosis Date  . Hypertension   . Seizures ([place])     Past Surgical History:  Procedure Laterality Date  . BRAIN SURGERY    . HERNIA REPAIR    . PROSTATE SURGERY      No family history on file.  Social History:  reports that he has been smoking.  He does not have any smokeless tobacco history on file. He reports that he does not drink alcohol. His drug history is not on file.  Allergies:  Allergies  Allergen Reactions  . Ibuprofen   . Penicillins   . Tylenol [Acetaminophen]     Medications: I have reviewed the patient's current medications.  Results for orders placed or performed during the hospital encounter of 09/09/17 (from the past 48 hour(s))  CBC     Status: Abnormal   Collection Time: 09/09/17  1:59 PM  Result Value Ref Range   WBC 11.7 (H) 4.0 - 10.5 K/uL   RBC 4.60 4.22 - 5.81 MIL/uL   Hemoglobin 14.4 13.0 - 17.0 g/dL   HCT 41.2 39.0 - 52.0 %   MCV 89.6 78.0 - 100.0 fL   MCH 31.3 26.0 - 34.0 pg   MCHC 35.0 30.0 - 36.0 g/dL   RDW 13.1 11.5 - 15.5 %   Platelets 234 150 - 400 K/uL    Comment: Performed at [facility] [address], [place], [place] [address]    Dg Femur Parsons Min 2 Views Left  Result Date: 09/09/2017 CLINICAL DATA:  42 y/o  M; chain saw injury. EXAM: LEFT FEMUR PORTABLE 2 VIEWS COMPARISON:  None. FINDINGS: There is no evidence of fracture or other focal bone lesions. Large anterior thigh soft tissue laceration. IMPRESSION: No acute bony abnormality. Large anterior thigh soft tissue laceration. Electronically Signed   By: [name] M.D.   On: 09/09/2017 13:50    Review of  Systems  Constitutional: Negative for weight loss.  HENT: Negative for ear discharge, ear pain, hearing loss and tinnitus.   Eyes: Negative for blurred vision, double vision, photophobia and pain.  Respiratory: Negative for cough, sputum production and shortness of breath.   Cardiovascular: Negative for chest pain.  Gastrointestinal: Negative for abdominal pain, nausea and vomiting.  Genitourinary: Negative for dysuria, flank pain, frequency and urgency.  Musculoskeletal: Positive for joint pain (Left thigh). Negative for back pain, falls, myalgias and neck pain.  Neurological: Positive for sensory change (Left leg). Negative for dizziness, tingling, focal weakness, loss of consciousness and headaches.  Endo/Heme/Allergies: Does not bruise/bleed easily.  Psychiatric/Behavioral: Negative for depression, memory loss and substance abuse. The patient is not nervous/anxious.    Blood pressure 138/82, pulse 84, temperature 97.6 F (36.4 C), temperature source Oral, resp. rate 16, height 5\' 10"  (1.778 m), weight 106.6 kg (235 lb), SpO2 100 %. Physical Exam  Constitutional: He appears well-developed and well-nourished. No distress.  HENT:  Head: Normocephalic and atraumatic.  Eyes: Conjunctivae are normal. Right eye exhibits no discharge. Left eye exhibits no discharge. No scleral icterus.  Neck: Normal range of motion.  Cardiovascular: Normal rate and regular rhythm.  Respiratory: Effort normal. No  respiratory distress.  Musculoskeletal:  LLE Large transverse lac to anterior thigh, no ecchymosis or rash  Severe TTP  No knee or ankle effusion  Knee stable to varus/ valgus and anterior/posterior stress but exam limited 2/2 pain  Sens DPN, SPN, TN absent  Motor EHL, ext, flex, evers 3/5  DP 2+, PT 1+, No significant edema  Neurological: He is alert.  Skin: Skin is warm and dry. He is not diaphoretic.  Psychiatric: He has a normal mood and affect. His behavior is normal.       Assessment/Plan: Left thigh laceration -- Will I&D and repair in ED. KI for home for comfort. IV vanc here then oral meds for home. F/u with Dr. Lorin Mercy in office in 10-14d.    Lisette Abu, PA-C Orthopedic Surgery [phone] 09/09/2017, 2:37 PM

## 2017-09-09 NOTE — Progress Notes (Signed)
Pt given taxi voucher to return home.   Wendelyn Breslow, [facility]  [phone]

## 2017-09-09 NOTE — Procedures (Addendum)
Procedure: Left thigh I&D and repair 30cm, complex  Indication: Left knee laceration  Surgeon: Silvestre Gunner, PA-C  Assist: None  Anesthesia: 6ml 1% plain lidocaine  EBL: None  Complications: None  Findings: After risks/benefits explained patient desires to undergo procedure. Consent obtained and time out performed. The laceration was anesthetized with lidocaine and anesthesia confirmed. 2061ml NS irrigated into wound with combination of blunt and sharp debridement. Deep layer closed with 3-0 Vicryl sutures. Skin closed with staples. Pt tolerated the procedure well.    Lisette Abu, PA-C Orthopedic Surgery [phone]

## 2017-09-09 NOTE — ED Triage Notes (Signed)
Pt brought in by EMS due to pt cutting his leg while trying to start his chainsaw. Per EMS, pt has a lac about 1-2in deep on left thigh. Pt very anxious. Bleeding control and lac covered with gauzes. VSS.

## 2017-09-09 NOTE — ED Notes (Signed)
Pt's IV infiltrated. Iv insertion by this RN unsuccessful; IV team consulted

## 2017-09-09 NOTE — ED Provider Notes (Signed)
[facility] EMERGENCY DEPARTMENT Provider Note   CSN: [id number] Arrival date & time: 09/09/17  1038     History   Chief Complaint Chief Complaint  Patient presents with  . Leg Injury    HPI [name] is a 42 y.o. male.  HPI  [name] is a 42yo male with a history of hypertension who presents to the emergency department for evaluation of left thigh laceration.  Patient reports that he was fixing his chain saw and accidentally hit his left thigh earlier today around 9 AM.  Reports that chainsaw was dirty and had oil on it.  Patient states that there was profuse bleeding, but it was able to be controlled by pressure.  He has not tried to clean the area.  He reports that he is completely numb from the knee down and is unable to move his toes or ankle due to pain.  He is unsure of his last tetanus vaccination.  Denies fever, chills. Is able to ambulate independently, although painful over the thigh.  Past Medical History:  Diagnosis Date  . Hypertension   . Seizures ([place])     There are no active problems to display for this patient.   Past Surgical History:  Procedure Laterality Date  . BRAIN SURGERY    . HERNIA REPAIR    . PROSTATE SURGERY          Home Medications    Prior to Admission medications   Not on File    Family History No family history on file.  Social History Social History   Tobacco Use  . Smoking status: Current Every Day Smoker  Substance Use Topics  . Alcohol use: Never    Frequency: Never  . Drug use: Not on file     Allergies   Ibuprofen; Penicillins; and Tylenol [acetaminophen]   Review of Systems Review of Systems  Constitutional: Negative for chills and fever.  Eyes: Negative for visual disturbance.  Respiratory: Negative for shortness of breath.   Cardiovascular: Negative for chest pain.  Gastrointestinal: Negative for abdominal pain.  Genitourinary: Negative for frequency.  Musculoskeletal: Positive  for myalgias (left thigh pain over laceration).  Skin: Positive for wound (left thigh laceration).  Neurological: Positive for numbness (below the knee). Negative for weakness.  Psychiatric/Behavioral: The patient is nervous/anxious.      Physical Exam Updated Vital Signs BP 138/82 (BP Location: Right Arm)   Pulse 84   Temp 97.6 F (36.4 C) (Oral)   Resp 16   Ht 5\' 10"  (1.778 m)   Wt 106.6 kg (235 lb)   SpO2 100%   BMI 33.72 kg/m   Physical Exam  Constitutional: He is oriented to person, place, and time. He appears well-developed and well-nourished. No distress.  HENT:  Head: Normocephalic and atraumatic.  Mouth/Throat: Oropharynx is clear and moist.  Eyes: Pupils are equal, round, and reactive to light. Conjunctivae are normal. Right eye exhibits no discharge. Left eye exhibits no discharge.  Neck: Normal range of motion. Neck supple.  Cardiovascular: Normal rate, regular rhythm and intact distal pulses. Exam reveals no friction rub.  No murmur heard. Pulmonary/Chest: Effort normal and breath sounds normal. No stridor. No respiratory distress. He has no wheezes.  Abdominal: Soft. There is no tenderness.  Musculoskeletal:  Approximately 8in horizontal laceration over the left anterior thigh. Approximately 1.5in deep.  No active bleeding.  No foreign bodies palpated. No surrounding erythema, warmth or induration.  Patient reports he cannot move ankle due  to pain, is able to move the toes.  DP pulses 2+ bilaterally.  Cap refill less than 2 seconds.  Neurological: He is alert and oriented to person, place, and time. Coordination normal.  Patient reports numbness in entire leg below the knee.  Unable to sense light or sharp touch.  Skin: Skin is warm and dry. Capillary refill takes less than 2 seconds. He is not diaphoretic.  Psychiatric: He has a normal mood and affect. His behavior is normal.  Nursing note and vitals reviewed.      ED Treatments / Results  Labs (all labs  ordered are listed, but only abnormal results are displayed) Labs Reviewed  CBC - Abnormal; Notable for the following components:      Result Value   WBC 11.7 (*)    All other components within normal limits  BASIC METABOLIC PANEL - Abnormal; Notable for the following components:   Sodium 134 (*)    CO2 18 (*)    Glucose, Bld 112 (*)    BUN 22 (*)    All other components within normal limits    EKG None  Radiology Dg Femur Port Min 2 Views Left  Result Date: 09/09/2017 CLINICAL DATA:  42 y/o  M; chain saw injury. EXAM: LEFT FEMUR PORTABLE 2 VIEWS COMPARISON:  None. FINDINGS: There is no evidence of fracture or other focal bone lesions. Large anterior thigh soft tissue laceration. IMPRESSION: No acute bony abnormality. Large anterior thigh soft tissue laceration. Electronically Signed   By: Kristine Garbe M.D.   On: 09/09/2017 13:50    Procedures Procedures (including critical care time)  Medications Ordered in ED Medications  ondansetron (ZOFRAN-ODT) disintegrating tablet 4 mg (4 mg Oral Given 09/09/17 1234)  traMADol (ULTRAM) tablet 50 mg (50 mg Oral Given 09/09/17 1234)  Tdap (BOOSTRIX) injection 0.5 mL (0.5 mLs Intramuscular Given 09/09/17 1405)  sodium chloride 0.9 % bolus 1,000 mL (0 mLs Intravenous Stopped 09/09/17 1705)  morphine 4 MG/ML injection 4 mg (4 mg Intravenous Given 09/09/17 1402)  ondansetron (ZOFRAN) injection 4 mg (4 mg Intravenous Given 09/09/17 1400)  vancomycin (VANCOCIN) IVPB 1000 mg/200 mL premix (0 mg Intravenous Stopped 09/09/17 1705)  lidocaine (PF) (XYLOCAINE) 1 % injection 30 mL (30 mLs Intradermal Given 09/09/17 1528)  morphine 4 MG/ML injection 4 mg (4 mg Intravenous Given 09/09/17 1525)     Initial Impression / Assessment and Plan / ED Course  I have reviewed the triage vital signs and the nursing notes.  Pertinent labs & imaging results that were available during my care of the patient were reviewed by me and considered in my medical  decision making (see chart for details).  Clinical Course as of Sep 09 1728  Tue Sep 09, 2017  1723 Spoke with Mariann Laster with social work.  Patient has been taking Levaquin for a respiratory illness for the last few days.  Will change antibiotic to doxycycline given h/o respiratory distress to penicillin.   [MM]    Clinical Course User Index [MM] McDonald, Mia A, PA-C    Presents with laceration on the left thigh due to chainsaw injury.  He reports numbness below the knee since the injury.  On exam he has a large approximately 8 inch laceration over the anterior left thigh which is about 1.5 inches deep.  No erythema, warmth or induration to suggest infection.  Do not suspect acute nervous system injury given atypical distribution of patient's numbness over the entire lower leg, discussed with Dr. Marcha Dutton who agrees.  Distal left lower extremity warm with 2+ DP pulse and normal cap refill, do not suspect acute ischemia.   Patient's Tdap updated while in the ER today given he is unsure of his last tetanus update.  Lab work reviewed.  BMP without any major electrolyte abnormalities.  His blood glucose is elevated at 112, he will need to have this rechecked by his primary care doctor and I spoke with him about this.  CBC with mild leukocytosis (WBC count 11.7), this is likely reactive.  Hemoglobin normal.  X-ray of femur without evidence of foreign body or bony injury.  Discussed this patient with Dr. Marcha Dutton who also saw the patient and agrees with orthopedic consult.  Orthopedic PA Orion Crook saw this patient.  He cleaned and repaired the laceration.  Patient received IV vancomycin in the emergency department and will be discharged home with doxycycline for the next 10 days given he has a history of respiratory distress to penicillins.  Recommend follow-up with Dr. Lorin Mercy in office in 10 to 14 days.  Listed this information in his discharge paperwork.  Discussed this with patient who agrees to the plan.   Case management evaluated this patient, given he states that he cannot afford antibiotic.  Case manager Mariann Laster set up patient to get reduced cost antibiotic at [facility].  Final Clinical Impressions(s) / ED Diagnoses   Final diagnoses:  Laceration of left lower extremity, initial encounter    ED Discharge Orders    None       Glyn Ade, PA-C 09/09/17 1744    Mabe, Forbes Cellar, MD 09/11/17 [phone]

## 2017-09-09 NOTE — Discharge Instructions (Addendum)
Wash wounds daily in shower with soap and water. Do not soak. Apply antibiotic ointment (e.g. Neosporin) twice daily and as needed to keep moist. Cover with dry dressing.  Use knee immobilizer when walking for comfort. May remove if don't need.

## 2017-09-09 NOTE — ED Notes (Signed)
Dr. Jacqulynn Cadet at bedside to clean and debride laceration, consent  Form signed and at bedside

## 2017-09-09 NOTE — ED Notes (Signed)
Pt given turkey sandwich and coke 

## 2017-09-10 ENCOUNTER — Telehealth (INDEPENDENT_AMBULATORY_CARE_PROVIDER_SITE_OTHER): Payer: Self-pay | Admitting: Orthopaedic Surgery

## 2017-09-10 NOTE — Telephone Encounter (Signed)
Talked with patient and he stated that incision is not bleeding, it's just a little fluid where the staple popped out. Patient stated that he has been elevating left leg and would not be able to come into the office.  See message below.  Please advise.  Thank you.

## 2017-09-10 NOTE — Telephone Encounter (Signed)
Will you please call and see if patient needs to come in?

## 2017-09-10 NOTE — Telephone Encounter (Signed)
Ucall. Just keep clean and put dry dressing on it and change it as needed daily. ucall thanks

## 2017-09-10 NOTE — Telephone Encounter (Signed)
Patient called wanting to ask Dr. Lorin Mercy a questions about his incision, he states one of the staples have "popped" and come loose, he was wondering if he could use butterfly bandages to keep the wound closed? CB # [phone]

## 2017-09-10 NOTE — Telephone Encounter (Signed)
Called and left VM advising patient of message concerning incision per Dr. Lorin Mercy.

## 2017-09-17 DIAGNOSIS — S71112A Laceration without foreign body, left thigh, initial encounter: Secondary | ICD-10-CM | POA: Diagnosis not present

## 2017-09-17 DIAGNOSIS — L03116 Cellulitis of left lower limb: Secondary | ICD-10-CM | POA: Diagnosis not present

## 2017-09-17 DIAGNOSIS — T8189XA Other complications of procedures, not elsewhere classified, initial encounter: Secondary | ICD-10-CM | POA: Diagnosis not present

## 2017-09-17 DIAGNOSIS — T148XXA Other injury of unspecified body region, initial encounter: Secondary | ICD-10-CM | POA: Diagnosis not present

## 2017-09-17 DIAGNOSIS — S81802A Unspecified open wound, left lower leg, initial encounter: Secondary | ICD-10-CM | POA: Diagnosis not present

## 2017-09-18 DIAGNOSIS — K219 Gastro-esophageal reflux disease without esophagitis: Secondary | ICD-10-CM | POA: Diagnosis present

## 2017-09-18 DIAGNOSIS — S81802A Unspecified open wound, left lower leg, initial encounter: Secondary | ICD-10-CM | POA: Diagnosis not present

## 2017-09-18 DIAGNOSIS — F17210 Nicotine dependence, cigarettes, uncomplicated: Secondary | ICD-10-CM | POA: Diagnosis present

## 2017-09-18 DIAGNOSIS — Z79899 Other long term (current) drug therapy: Secondary | ICD-10-CM | POA: Diagnosis not present

## 2017-09-18 DIAGNOSIS — Z888 Allergy status to other drugs, medicaments and biological substances status: Secondary | ICD-10-CM | POA: Diagnosis not present

## 2017-09-18 DIAGNOSIS — J449 Chronic obstructive pulmonary disease, unspecified: Secondary | ICD-10-CM | POA: Diagnosis present

## 2017-09-18 DIAGNOSIS — Z8673 Personal history of transient ischemic attack (TIA), and cerebral infarction without residual deficits: Secondary | ICD-10-CM | POA: Diagnosis not present

## 2017-09-18 DIAGNOSIS — F319 Bipolar disorder, unspecified: Secondary | ICD-10-CM | POA: Diagnosis present

## 2017-09-18 DIAGNOSIS — L089 Local infection of the skin and subcutaneous tissue, unspecified: Secondary | ICD-10-CM | POA: Diagnosis present

## 2017-09-18 DIAGNOSIS — Z880 Allergy status to penicillin: Secondary | ICD-10-CM | POA: Diagnosis not present

## 2017-09-18 DIAGNOSIS — E119 Type 2 diabetes mellitus without complications: Secondary | ICD-10-CM | POA: Diagnosis not present

## 2017-09-18 DIAGNOSIS — E669 Obesity, unspecified: Secondary | ICD-10-CM | POA: Diagnosis not present

## 2017-09-18 DIAGNOSIS — T148XXA Other injury of unspecified body region, initial encounter: Secondary | ICD-10-CM | POA: Diagnosis not present

## 2017-09-18 DIAGNOSIS — S81812A Laceration without foreign body, left lower leg, initial encounter: Secondary | ICD-10-CM | POA: Diagnosis not present

## 2017-09-18 DIAGNOSIS — Z881 Allergy status to other antibiotic agents status: Secondary | ICD-10-CM | POA: Diagnosis not present

## 2017-09-18 DIAGNOSIS — F419 Anxiety disorder, unspecified: Secondary | ICD-10-CM | POA: Diagnosis present

## 2017-09-18 DIAGNOSIS — L03116 Cellulitis of left lower limb: Secondary | ICD-10-CM | POA: Diagnosis not present

## 2017-09-18 DIAGNOSIS — I10 Essential (primary) hypertension: Secondary | ICD-10-CM | POA: Diagnosis present

## 2017-09-18 DIAGNOSIS — T8141XA Infection following a procedure, superficial incisional surgical site, initial encounter: Secondary | ICD-10-CM | POA: Diagnosis not present

## 2017-09-18 DIAGNOSIS — S71112A Laceration without foreign body, left thigh, initial encounter: Secondary | ICD-10-CM | POA: Diagnosis present

## 2017-09-24 DIAGNOSIS — S81802D Unspecified open wound, left lower leg, subsequent encounter: Secondary | ICD-10-CM | POA: Diagnosis not present

## 2017-09-24 DIAGNOSIS — L03116 Cellulitis of left lower limb: Secondary | ICD-10-CM | POA: Diagnosis not present

## 2017-09-26 DIAGNOSIS — S71102A Unspecified open wound, left thigh, initial encounter: Secondary | ICD-10-CM | POA: Diagnosis not present

## 2017-09-26 DIAGNOSIS — E1140 Type 2 diabetes mellitus with diabetic neuropathy, unspecified: Secondary | ICD-10-CM | POA: Diagnosis not present

## 2017-09-26 DIAGNOSIS — T8141XA Infection following a procedure, superficial incisional surgical site, initial encounter: Secondary | ICD-10-CM | POA: Diagnosis not present

## 2017-09-29 DIAGNOSIS — S71102A Unspecified open wound, left thigh, initial encounter: Secondary | ICD-10-CM | POA: Diagnosis not present

## 2017-09-29 DIAGNOSIS — E1140 Type 2 diabetes mellitus with diabetic neuropathy, unspecified: Secondary | ICD-10-CM | POA: Diagnosis not present

## 2017-10-01 DIAGNOSIS — S71102A Unspecified open wound, left thigh, initial encounter: Secondary | ICD-10-CM | POA: Diagnosis not present

## 2017-10-01 DIAGNOSIS — E1140 Type 2 diabetes mellitus with diabetic neuropathy, unspecified: Secondary | ICD-10-CM | POA: Diagnosis not present

## 2017-10-03 DIAGNOSIS — T8189XA Other complications of procedures, not elsewhere classified, initial encounter: Secondary | ICD-10-CM | POA: Diagnosis not present

## 2017-10-03 DIAGNOSIS — S71102A Unspecified open wound, left thigh, initial encounter: Secondary | ICD-10-CM | POA: Diagnosis not present

## 2017-10-06 DIAGNOSIS — S71102A Unspecified open wound, left thigh, initial encounter: Secondary | ICD-10-CM | POA: Diagnosis not present

## 2017-10-08 DIAGNOSIS — S71102A Unspecified open wound, left thigh, initial encounter: Secondary | ICD-10-CM | POA: Diagnosis not present

## 2017-10-10 DIAGNOSIS — T8189XA Other complications of procedures, not elsewhere classified, initial encounter: Secondary | ICD-10-CM | POA: Diagnosis not present

## 2017-10-10 DIAGNOSIS — S71102A Unspecified open wound, left thigh, initial encounter: Secondary | ICD-10-CM | POA: Diagnosis not present

## 2017-10-15 DIAGNOSIS — R1084 Generalized abdominal pain: Secondary | ICD-10-CM | POA: Diagnosis not present

## 2017-10-15 DIAGNOSIS — R000 Tachycardia, unspecified: Secondary | ICD-10-CM | POA: Diagnosis not present

## 2017-10-15 DIAGNOSIS — K760 Fatty (change of) liver, not elsewhere classified: Secondary | ICD-10-CM | POA: Diagnosis not present

## 2017-10-15 DIAGNOSIS — R1032 Left lower quadrant pain: Secondary | ICD-10-CM | POA: Diagnosis not present

## 2017-10-15 DIAGNOSIS — R52 Pain, unspecified: Secondary | ICD-10-CM | POA: Diagnosis not present

## 2017-10-22 DIAGNOSIS — S71102A Unspecified open wound, left thigh, initial encounter: Secondary | ICD-10-CM | POA: Diagnosis not present

## 2017-10-24 DIAGNOSIS — L03116 Cellulitis of left lower limb: Secondary | ICD-10-CM | POA: Diagnosis not present

## 2017-10-24 DIAGNOSIS — S71102A Unspecified open wound, left thigh, initial encounter: Secondary | ICD-10-CM | POA: Diagnosis not present

## 2017-10-24 DIAGNOSIS — S81801A Unspecified open wound, right lower leg, initial encounter: Secondary | ICD-10-CM | POA: Diagnosis not present

## 2017-10-24 DIAGNOSIS — T8189XA Other complications of procedures, not elsewhere classified, initial encounter: Secondary | ICD-10-CM | POA: Diagnosis not present

## 2017-11-06 ENCOUNTER — Other Ambulatory Visit: Payer: Self-pay

## 2017-11-06 ENCOUNTER — Emergency Department (HOSPITAL_COMMUNITY): Payer: Medicare Other

## 2017-11-06 ENCOUNTER — Observation Stay (HOSPITAL_COMMUNITY)
Admission: EM | Admit: 2017-11-06 | Discharge: 2017-11-07 | Disposition: A | Discharge status: Home Health | Payer: Medicare Other | Attending: Internal Medicine | Admitting: Internal Medicine

## 2017-11-06 ENCOUNTER — Encounter (HOSPITAL_COMMUNITY): Payer: Self-pay | Admitting: Emergency Medicine

## 2017-11-06 DIAGNOSIS — R29810 Facial weakness: Secondary | ICD-10-CM | POA: Diagnosis not present

## 2017-11-06 DIAGNOSIS — F319 Bipolar disorder, unspecified: Secondary | ICD-10-CM | POA: Diagnosis not present

## 2017-11-06 DIAGNOSIS — E785 Hyperlipidemia, unspecified: Secondary | ICD-10-CM | POA: Diagnosis not present

## 2017-11-06 DIAGNOSIS — R569 Unspecified convulsions: Secondary | ICD-10-CM | POA: Diagnosis not present

## 2017-11-06 DIAGNOSIS — Z9181 History of falling: Secondary | ICD-10-CM | POA: Insufficient documentation

## 2017-11-06 DIAGNOSIS — R531 Weakness: Secondary | ICD-10-CM | POA: Diagnosis not present

## 2017-11-06 DIAGNOSIS — Z79899 Other long term (current) drug therapy: Secondary | ICD-10-CM | POA: Insufficient documentation

## 2017-11-06 DIAGNOSIS — G529 Cranial nerve disorder, unspecified: Secondary | ICD-10-CM | POA: Diagnosis not present

## 2017-11-06 DIAGNOSIS — Z6832 Body mass index (BMI) 32.0-32.9, adult: Secondary | ICD-10-CM | POA: Diagnosis not present

## 2017-11-06 DIAGNOSIS — F17210 Nicotine dependence, cigarettes, uncomplicated: Secondary | ICD-10-CM | POA: Insufficient documentation

## 2017-11-06 DIAGNOSIS — J449 Chronic obstructive pulmonary disease, unspecified: Secondary | ICD-10-CM | POA: Diagnosis present

## 2017-11-06 DIAGNOSIS — R2990 Unspecified symptoms and signs involving the nervous system: Secondary | ICD-10-CM | POA: Diagnosis not present

## 2017-11-06 DIAGNOSIS — R202 Paresthesia of skin: Secondary | ICD-10-CM

## 2017-11-06 DIAGNOSIS — G40309 Generalized idiopathic epilepsy and epileptic syndromes, not intractable, without status epilepticus: Secondary | ICD-10-CM | POA: Diagnosis not present

## 2017-11-06 DIAGNOSIS — I10 Essential (primary) hypertension: Secondary | ICD-10-CM | POA: Diagnosis not present

## 2017-11-06 DIAGNOSIS — F445 Conversion disorder with seizures or convulsions: Secondary | ICD-10-CM | POA: Diagnosis present

## 2017-11-06 HISTORY — DX: Chronic obstructive pulmonary disease, unspecified: J44.9

## 2017-11-06 LAB — DIFFERENTIAL
Abs Immature Granulocytes: 0 10*3/uL (ref 0.0–0.1)
Basophils Absolute: 0.1 10*3/uL (ref 0.0–0.1)
Basophils Relative: 1 %
EOS PCT: 1 %
Eosinophils Absolute: 0.1 10*3/uL (ref 0.0–0.7)
Immature Granulocytes: 1 %
LYMPHS ABS: 2.2 10*3/uL (ref 0.7–4.0)
LYMPHS PCT: 26 %
Monocytes Absolute: 1 10*3/uL (ref 0.1–1.0)
Monocytes Relative: 11 %
NEUTROS ABS: 5.1 10*3/uL (ref 1.7–7.7)
Neutrophils Relative %: 60 %

## 2017-11-06 LAB — COMPREHENSIVE METABOLIC PANEL
ALBUMIN: 3.8 g/dL (ref 3.5–5.0)
ALK PHOS: 58 U/L (ref 38–126)
ALT: 26 U/L (ref 17–63)
ANION GAP: 11 (ref 5–15)
AST: 27 U/L (ref 15–41)
BUN: 19 mg/dL (ref 6–20)
CALCIUM: 9.8 mg/dL (ref 8.9–10.3)
CO2: 22 mmol/L (ref 22–32)
CREATININE: 1.11 mg/dL (ref 0.61–1.24)
Chloride: 106 mmol/L (ref 101–111)
GFR calc Af Amer: >60 mL/min (ref >60)
GFR calc non Af Amer: >60 mL/min (ref >60)
GLUCOSE: 98 mg/dL (ref 65–99)
Potassium: 3.9 mmol/L (ref 3.5–5.1)
Sodium: 139 mmol/L (ref 135–145)
TOTAL PROTEIN: 6.9 g/dL (ref 6.5–8.1)
Total Bilirubin: 0.6 mg/dL (ref 0.3–1.2)

## 2017-11-06 LAB — HEMOGLOBIN A1C
Hgb A1c MFr Bld: 5.7 % — ABNORMAL HIGH (ref 4.8–5.6)
Mean Plasma Glucose: 116.89 mg/dL

## 2017-11-06 LAB — CBG MONITORING, ED: GLUCOSE-CAPILLARY: 98 mg/dL (ref 65–99)

## 2017-11-06 LAB — I-STAT CHEM 8, ED
BUN: 20 mg/dL (ref 6–20)
CHLORIDE: 104 mmol/L (ref 101–111)
CREATININE: 0.9 mg/dL (ref 0.61–1.24)
Calcium, Ion: 1.07 mmol/L — ABNORMAL LOW (ref 1.15–1.40)
GLUCOSE: 94 mg/dL (ref 65–99)
HCT: 44 % (ref 39.0–52.0)
Hemoglobin: 15 g/dL (ref 13.0–17.0)
POTASSIUM: 3.7 mmol/L (ref 3.5–5.1)
Sodium: 138 mmol/L (ref 135–145)
TCO2: 22 mmol/L (ref 22–32)

## 2017-11-06 LAB — CBC
HEMATOCRIT: 43.6 % (ref 39.0–52.0)
HEMOGLOBIN: 13.9 g/dL (ref 13.0–17.0)
MCH: 30.1 pg (ref 26.0–34.0)
MCHC: 31.9 g/dL (ref 30.0–36.0)
MCV: 94.4 fL (ref 78.0–100.0)
Platelets: 248 10*3/uL (ref 150–400)
RBC: 4.62 MIL/uL (ref 4.22–5.81)
RDW: 13.2 % (ref 11.5–15.5)
WBC: 8.4 10*3/uL (ref 4.0–10.5)

## 2017-11-06 LAB — TSH: TSH: 1.593 u[IU]/mL (ref 0.350–4.500)

## 2017-11-06 LAB — I-STAT TROPONIN, ED: Troponin i, poc: 0.01 ng/mL (ref 0.00–0.08)

## 2017-11-06 LAB — APTT: aPTT: 25 seconds (ref 24–36)

## 2017-11-06 LAB — PROTIME-INR
INR: 1
Prothrombin Time: 13.1 seconds (ref 11.4–15.2)

## 2017-11-06 LAB — C-REACTIVE PROTEIN

## 2017-11-06 LAB — VALPROIC ACID LEVEL: Valproic Acid Lvl: 120 ug/mL — ABNORMAL HIGH (ref 50.0–100.0)

## 2017-11-06 LAB — SEDIMENTATION RATE: Sed Rate: 2 mm/hr (ref 0–16)

## 2017-11-06 MED ORDER — ALLOPURINOL 300 MG PO TABS
300.0000 mg | ORAL_TABLET | Freq: Every day | ORAL | Status: DC
  Administered 2017-11-07: 300 mg via ORAL
  Filled 2017-11-06: qty 1
  Filled 2017-11-06: qty 3

## 2017-11-06 MED ORDER — STROKE: EARLY STAGES OF RECOVERY BOOK
Freq: Once | Status: AC
  Administered 2017-11-06: 19:00:00
  Filled 2017-11-06: qty 1

## 2017-11-06 MED ORDER — RISPERIDONE 2 MG PO TABS
4.0000 mg | ORAL_TABLET | Freq: Every day | ORAL | Status: DC
  Administered 2017-11-07: 4 mg via ORAL
  Filled 2017-11-06: qty 2

## 2017-11-06 MED ORDER — ATORVASTATIN CALCIUM 20 MG PO TABS: 20.0000 mg | ORAL_TABLET | Freq: Every day | ORAL | Status: DC

## 2017-11-06 MED ORDER — FLUTICASONE PROPIONATE 50 MCG/ACT NA SUSP
2.0000 | Freq: Every day | NASAL | Status: DC
  Administered 2017-11-07: 2 via NASAL
  Filled 2017-11-06: qty 16

## 2017-11-06 MED ORDER — LEVETIRACETAM IN NACL 1000 MG/100ML IV SOLN
1000.0000 mg | Freq: Once | INTRAVENOUS | Status: AC
  Administered 2017-11-06: 1000 mg via INTRAVENOUS
  Filled 2017-11-06: qty 100

## 2017-11-06 MED ORDER — DEXTROSE 5 % IV SOLN
1000.0000 mg | Freq: Once | INTRAVENOUS | Status: AC
  Administered 2017-11-06: 1000 mg via INTRAVENOUS
  Filled 2017-11-06: qty 10

## 2017-11-06 MED ORDER — SENNOSIDES-DOCUSATE SODIUM 8.6-50 MG PO TABS: 1.0000 | ORAL_TABLET | Freq: Every evening | ORAL | Status: DC | PRN

## 2017-11-06 MED ORDER — ONDANSETRON HCL 4 MG/2ML IJ SOLN
4.0000 mg | Freq: Four times a day (QID) | INTRAMUSCULAR | Status: DC | PRN
  Administered 2017-11-06: 4 mg via INTRAVENOUS
  Filled 2017-11-06: qty 2

## 2017-11-06 MED ORDER — LEVETIRACETAM 500 MG PO TABS
500.0000 mg | ORAL_TABLET | Freq: Every morning | ORAL | Status: DC
  Administered 2017-11-07: 500 mg via ORAL
  Filled 2017-11-06: qty 1

## 2017-11-06 MED ORDER — IOPAMIDOL (ISOVUE-370) INJECTION 76%
INTRAVENOUS | Status: AC
  Filled 2017-11-06: qty 100

## 2017-11-06 MED ORDER — LEVETIRACETAM 500 MG PO TABS: 500.0000 mg | ORAL_TABLET | ORAL | Status: DC

## 2017-11-06 MED ORDER — ATORVASTATIN CALCIUM 80 MG PO TABS
80.0000 mg | ORAL_TABLET | Freq: Every day | ORAL | Status: DC
  Administered 2017-11-07: 80 mg via ORAL
  Filled 2017-11-06: qty 1

## 2017-11-06 MED ORDER — DIVALPROEX SODIUM ER 500 MG PO TB24: 1500.0000 mg | ORAL_TABLET | Freq: Two times a day (BID) | ORAL | Status: DC

## 2017-11-06 MED ORDER — PANTOPRAZOLE SODIUM 40 MG PO TBEC
40.0000 mg | DELAYED_RELEASE_TABLET | Freq: Two times a day (BID) | ORAL | Status: DC
  Administered 2017-11-07: 40 mg via ORAL
  Filled 2017-11-06: qty 1

## 2017-11-06 MED ORDER — ENOXAPARIN SODIUM 40 MG/0.4ML ~~LOC~~ SOLN
40.0000 mg | SUBCUTANEOUS | Status: DC
  Administered 2017-11-06: 40 mg via SUBCUTANEOUS
  Filled 2017-11-06: qty 0.4

## 2017-11-06 MED ORDER — ALBUTEROL SULFATE (2.5 MG/3ML) 0.083% IN NEBU: 2.5000 mg | INHALATION_SOLUTION | RESPIRATORY_TRACT | Status: DC | PRN

## 2017-11-06 MED ORDER — DIVALPROEX SODIUM ER 500 MG PO TB24
1500.0000 mg | ORAL_TABLET | Freq: Two times a day (BID) | ORAL | Status: DC
  Administered 2017-11-07: 1500 mg via ORAL
  Filled 2017-11-06: qty 3

## 2017-11-06 MED ORDER — TAMSULOSIN HCL 0.4 MG PO CAPS: 0.4000 mg | ORAL_CAPSULE | Freq: Every day | ORAL | Status: DC

## 2017-11-06 MED ORDER — CLOPIDOGREL BISULFATE 75 MG PO TABS
75.0000 mg | ORAL_TABLET | Freq: Every day | ORAL | Status: DC
  Administered 2017-11-07: 75 mg via ORAL
  Filled 2017-11-06: qty 1

## 2017-11-06 MED ORDER — LEVETIRACETAM 500 MG PO TABS: 1000.0000 mg | ORAL_TABLET | Freq: Every evening | ORAL | Status: DC

## 2017-11-06 MED ORDER — IOPAMIDOL (ISOVUE-370) INJECTION 76%
50.0000 mL | Freq: Once | INTRAVENOUS | Status: AC | PRN
  Administered 2017-11-06: 50 mL via INTRAVENOUS

## 2017-11-06 MED ORDER — LORAZEPAM 2 MG/ML IJ SOLN
1.0000 mg | Freq: Once | INTRAMUSCULAR | Status: AC
  Administered 2017-11-06: 1 mg via INTRAVENOUS
  Filled 2017-11-06: qty 1

## 2017-11-06 MED ORDER — SODIUM CHLORIDE 0.9 % IV SOLN
INTRAVENOUS | Status: DC
  Administered 2017-11-06: 19:00:00 via INTRAVENOUS

## 2017-11-06 MED ORDER — VALPROATE SODIUM 500 MG/5ML IV SOLN
1000.0000 mg | Freq: Once | INTRAVENOUS | Status: AC
  Administered 2017-11-06: 1000 mg via INTRAVENOUS
  Filled 2017-11-06: qty 10

## 2017-11-06 MED ORDER — GABAPENTIN 300 MG PO CAPS
600.0000 mg | ORAL_CAPSULE | Freq: Three times a day (TID) | ORAL | Status: DC
  Administered 2017-11-07: 600 mg via ORAL
  Filled 2017-11-06: qty 2

## 2017-11-06 NOTE — ED Triage Notes (Addendum)
[facility]S  patient was seen by brother this morning at 0800 and patient was fine. Patient took at bus to the MD office and when he got to the office he had some left sided weakness and right sided facial droop. Patient able to follow commands, Per EMS patient fell yesterday and hit his head.Patient unable to recall where he hit his hand denies LOC per EMS. Patient denies any blood thinners. Patient reports blurred vision in right eye.

## 2017-11-06 NOTE — ED Notes (Signed)
Neuro made aware patient failed swallow.

## 2017-11-06 NOTE — Progress Notes (Signed)
Pt arrived to unit. Agree w/prior assessment. Dr. Lorin Mercy notified.

## 2017-11-06 NOTE — Progress Notes (Signed)
Received request to call brother and also mother to give information. I spoke to patient and he said he would prefer for me to have them call him and he give them information. Brother Dominica Severin said, "well he can't talk, he has slurred speech". I informed him that he would be able to understand him. He said thank you.
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
## 2017-11-06 NOTE — ED Provider Notes (Addendum)
[facility] EMERGENCY DEPARTMENT Provider Note   CSN: [id number] Arrival date & time: 11/06/17  1039   An emergency department physician performed an initial assessment on this suspected stroke patient at 1042.  History   Chief Complaint Chief Complaint  Patient presents with  . Code Stroke    HPI [name] is a 42 y.o. male.  Patient c/o fall yesterday at home, hit head, felt weak since.  Symptoms moderate, persistent, no specific exacerbating or alleviating factors. Pt went to pcp today, and mentioned left sided numbness, so they sent to ED via ems. Code stroke activated pta. Neurology in ED to see. Pt w unusual affect/poor historian. States 'felt bad' since yesterday. Was able to get to doctors office today, ambulatory. Denies headache. No neck pain or back pain. No chest pain or sob. No abd pain. No nvd. No gu c/o. No fever or chills.   The history is provided by the patient and the EMS personnel. The history is limited by the condition of the patient.    Past Medical History:  Diagnosis Date  . Hypertension   . Seizures ([place])     There are no active problems to display for this patient.   Past Surgical History:  Procedure Laterality Date  . BRAIN SURGERY    . HERNIA REPAIR    . PROSTATE SURGERY          Home Medications    Prior to Admission medications   Medication Sig Start Date End Date Taking? Authorizing Provider  traMADol (ULTRAM) 50 MG tablet Take 1 tablet (50 mg total) by mouth every 6 (six) hours as needed. 09/09/17   Glyn Ade, PA-C    Family History No family history on file.  Social History Social History   Tobacco Use  . Smoking status: Current Every Day Smoker  Substance Use Topics  . Alcohol use: Never    Frequency: Never  . Drug use: Not on file     Allergies   Asa [aspirin]; Ibuprofen; Nitrofuran derivatives; Penicillins; and Tylenol [acetaminophen]   Review of Systems Review of Systems    Constitutional: Negative for fever.  HENT: Negative for sore throat.   Eyes: Negative for visual disturbance.  Respiratory: Negative for shortness of breath.   Cardiovascular: Negative for chest pain.  Gastrointestinal: Negative for abdominal pain and vomiting.  Genitourinary: Negative for flank pain.  Musculoskeletal: Negative for back pain and neck pain.  Skin: Negative for rash.  Neurological: Positive for numbness. Negative for headaches.  Hematological: Does not bruise/bleed easily.  Psychiatric/Behavioral: The patient is nervous/anxious.      Physical Exam Updated Vital Signs BP 138/81   Pulse 76   Resp 17   Wt 107.4 kg (236 lb 12.4 oz)   SpO2 99%   BMI 33.97 kg/m   Physical Exam  Constitutional: He appears well-developed and well-nourished.  HENT:  Mouth/Throat: Oropharynx is clear and moist.  Eyes: Pupils are equal, round, and reactive to light. Conjunctivae are normal.  Neck: Neck supple. No tracheal deviation present.  No bruits.   Cardiovascular: Normal rate, regular rhythm, normal heart sounds and intact distal pulses. Exam reveals no gallop and no friction rub.  No murmur heard. Pulmonary/Chest: Effort normal and breath sounds normal. No accessory muscle usage. No respiratory distress.  Abdominal: Soft. Bowel sounds are normal. He exhibits no distension. There is no tenderness.  Genitourinary:  Genitourinary Comments: No cva tenderness.  Musculoskeletal: He exhibits no edema or tenderness.  CTLS spine,  non tender, aligned, no step off. Good rom bil ext without focal bony tenderness.   Neurological: He is alert. No cranial nerve deficit.  Speech fluent. Motor intact bil, stre 5/5. sens grossly intact.   Skin: Skin is warm and dry.  Psychiatric:  Anxious appearing.   Nursing note and vitals reviewed.    ED Treatments / Results  Labs (all labs ordered are listed, but only abnormal results are displayed) Results for orders placed or performed during the  hospital encounter of 11/06/17  Protime-INR  Result Value Ref Range   Prothrombin Time 13.1 11.4 - 15.2 seconds   INR 1.00   APTT  Result Value Ref Range   aPTT 25 24 - 36 seconds  CBC  Result Value Ref Range   WBC 8.4 4.0 - 10.5 K/uL   RBC 4.62 4.22 - 5.81 MIL/uL   Hemoglobin 13.9 13.0 - 17.0 g/dL   HCT 43.6 39.0 - 52.0 %   MCV 94.4 78.0 - 100.0 fL   MCH 30.1 26.0 - 34.0 pg   MCHC 31.9 30.0 - 36.0 g/dL   RDW 13.2 11.5 - 15.5 %   Platelets 248 150 - 400 K/uL  Differential  Result Value Ref Range   Neutrophils Relative % 60 %   Neutro Abs 5.1 1.7 - 7.7 K/uL   Lymphocytes Relative 26 %   Lymphs Abs 2.2 0.7 - 4.0 K/uL   Monocytes Relative 11 %   Monocytes Absolute 1.0 0.1 - 1.0 K/uL   Eosinophils Relative 1 %   Eosinophils Absolute 0.1 0.0 - 0.7 K/uL   Basophils Relative 1 %   Basophils Absolute 0.1 0.0 - 0.1 K/uL   Immature Granulocytes 1 %   Abs Immature Granulocytes 0.0 0.0 - 0.1 K/uL  Comprehensive metabolic panel  Result Value Ref Range   Sodium 139 135 - 145 mmol/L   Potassium 3.9 3.5 - 5.1 mmol/L   Chloride 106 101 - 111 mmol/L   CO2 22 22 - 32 mmol/L   Glucose, Bld 98 65 - 99 mg/dL   BUN 19 6 - 20 mg/dL   Creatinine, Ser 1.11 0.61 - 1.24 mg/dL   Calcium 9.8 8.9 - 10.3 mg/dL   Total Protein 6.9 6.5 - 8.1 g/dL   Albumin 3.8 3.5 - 5.0 g/dL   AST 27 15 - 41 U/L   ALT 26 17 - 63 U/L   Alkaline Phosphatase 58 38 - 126 U/L   Total Bilirubin 0.6 0.3 - 1.2 mg/dL   GFR calc non Af Amer >60 >60 mL/min   GFR calc Af Amer >60 >60 mL/min   Anion gap 11 5 - 15  I-stat troponin, ED  Result Value Ref Range   Troponin i, poc 0.01 0.00 - 0.08 ng/mL   Comment 3          CBG monitoring, ED  Result Value Ref Range   Glucose-Capillary 98 65 - 99 mg/dL  I-Stat Chem 8, ED  Result Value Ref Range   Sodium 138 135 - 145 mmol/L   Potassium 3.7 3.5 - 5.1 mmol/L   Chloride 104 101 - 111 mmol/L   BUN 20 6 - 20 mg/dL   Creatinine, Ser 0.90 0.61 - 1.24 mg/dL   Glucose, Bld 94 65  - 99 mg/dL   Calcium, Ion 1.07 (L) 1.15 - 1.40 mmol/L   TCO2 22 22 - 32 mmol/L   Hemoglobin 15.0 13.0 - 17.0 g/dL   HCT 44.0 39.0 - 52.0 %  Ct Angio Head W Or Wo Contrast  Result Date: 11/06/2017 CLINICAL DATA:  Code stroke. New onset left-sided weakness and right-sided facial droop. EXAM: CT ANGIOGRAPHY HEAD AND NECK TECHNIQUE: Multidetector CT imaging of the head and neck was performed using the standard protocol during bolus administration of intravenous contrast. Multiplanar CT image reconstructions and MIPs were obtained to evaluate the vascular anatomy. Carotid stenosis measurements (when applicable) are obtained utilizing NASCET criteria, using the distal internal carotid diameter as the denominator. CONTRAST:  28mL ISOVUE-370 IOPAMIDOL (ISOVUE-370) INJECTION 76% COMPARISON:  CT head without contrast 11/06/2017. FINDINGS: CTA NECK FINDINGS Aortic arch: An aberrancy right subclavian artery is present. A hypoplastic left vertebral artery originates from the aortic arch. Aorta is within normal limits for size. No significant vascular calcifications are present. Right carotid system: The right common carotid artery is within normal limits. The right vertebral artery origin is from the right common carotid artery. Bifurcation is unremarkable. Cervical right ICA is normal. Left carotid system: The left common carotid artery is within normal limits. Left carotid bifurcation is unremarkable. The cervical left ICA is normal. Vertebral arteries: As stated above, the left vertebral artery originates directly from the aortic arch. The right vertebral artery originates from the right common carotid artery. The right vertebral artery is the dominant vessel. There is no significant stenosis of either vertebral artery in the neck. Skeleton: Cervical spine is unremarkable. No focal lytic or blastic lesions are present. Vertebral body heights are normal. AP alignment is anatomic. There straightening of the normal  cervical lordosis. No focal lytic or blastic lesions are present. The patient is edentulous. Other neck: Soft tissues of the neck are otherwise unremarkable. Upper chest: Mild dependent atelectasis is present. No focal nodule, mass, or airspace disease is present. Review of the MIP images confirms the above findings CTA HEAD FINDINGS Anterior circulation: The internal carotid arteries are within normal limits from the skull base through the ICA termini. The A1 and M1 segments are normal. Anterior communicating artery is patent. Extensive medium and small vessel disease is present bilaterally. No significant proximal stenosis or occlusion is present. Posterior circulation: The right vertebral artery is the dominant vessel. Vertebrobasilar junction is intact. PICA origins are visualized and normal. Basilar artery is small. The left posterior cerebral artery originates from the basilar tip. The right posterior cerebral artery is of fetal type with a small P1 segment. There is diffuse irregularity throughout the PCA branch vessels bilaterally without a significant proximal stenosis or occlusion. No aneurysm is present. Venous sinuses: Dural sinuses are patent. The straight sinus and deep cerebral veins are intact. Cortical veins are unremarkable. Anatomic variants: Fetal type right posterior cerebral artery. Delayed phase: Postcontrast images demonstrate no pathologic enhancement. No infarct is evident. Review of the MIP images confirms the above findings IMPRESSION: 1. Extensive medium and small vessel disease throughout the anterior and posterior circulation consistent with intracranial atherosclerotic disease or vasculitis. 2. no significant proximal stenosis or occlusion to account for the patient's acute symptoms. No emergent large vessel occlusion. Electronically Signed   By: San Morelle M.D.   On: 11/06/2017 14:37   Ct Angio Neck W Or Wo Contrast  Result Date: 11/06/2017 CLINICAL DATA:  Code stroke.  New onset left-sided weakness and right-sided facial droop. EXAM: CT ANGIOGRAPHY HEAD AND NECK TECHNIQUE: Multidetector CT imaging of the head and neck was performed using the standard protocol during bolus administration of intravenous contrast. Multiplanar CT image reconstructions and MIPs were obtained to evaluate the vascular anatomy.  Carotid stenosis measurements (when applicable) are obtained utilizing NASCET criteria, using the distal internal carotid diameter as the denominator. CONTRAST:  73mL ISOVUE-370 IOPAMIDOL (ISOVUE-370) INJECTION 76% COMPARISON:  CT head without contrast 11/06/2017. FINDINGS: CTA NECK FINDINGS Aortic arch: An aberrancy right subclavian artery is present. A hypoplastic left vertebral artery originates from the aortic arch. Aorta is within normal limits for size. No significant vascular calcifications are present. Right carotid system: The right common carotid artery is within normal limits. The right vertebral artery origin is from the right common carotid artery. Bifurcation is unremarkable. Cervical right ICA is normal. Left carotid system: The left common carotid artery is within normal limits. Left carotid bifurcation is unremarkable. The cervical left ICA is normal. Vertebral arteries: As stated above, the left vertebral artery originates directly from the aortic arch. The right vertebral artery originates from the right common carotid artery. The right vertebral artery is the dominant vessel. There is no significant stenosis of either vertebral artery in the neck. Skeleton: Cervical spine is unremarkable. No focal lytic or blastic lesions are present. Vertebral body heights are normal. AP alignment is anatomic. There straightening of the normal cervical lordosis. No focal lytic or blastic lesions are present. The patient is edentulous. Other neck: Soft tissues of the neck are otherwise unremarkable. Upper chest: Mild dependent atelectasis is present. No focal nodule, mass, or  airspace disease is present. Review of the MIP images confirms the above findings CTA HEAD FINDINGS Anterior circulation: The internal carotid arteries are within normal limits from the skull base through the ICA termini. The A1 and M1 segments are normal. Anterior communicating artery is patent. Extensive medium and small vessel disease is present bilaterally. No significant proximal stenosis or occlusion is present. Posterior circulation: The right vertebral artery is the dominant vessel. Vertebrobasilar junction is intact. PICA origins are visualized and normal. Basilar artery is small. The left posterior cerebral artery originates from the basilar tip. The right posterior cerebral artery is of fetal type with a small P1 segment. There is diffuse irregularity throughout the PCA branch vessels bilaterally without a significant proximal stenosis or occlusion. No aneurysm is present. Venous sinuses: Dural sinuses are patent. The straight sinus and deep cerebral veins are intact. Cortical veins are unremarkable. Anatomic variants: Fetal type right posterior cerebral artery. Delayed phase: Postcontrast images demonstrate no pathologic enhancement. No infarct is evident. Review of the MIP images confirms the above findings IMPRESSION: 1. Extensive medium and small vessel disease throughout the anterior and posterior circulation consistent with intracranial atherosclerotic disease or vasculitis. 2. no significant proximal stenosis or occlusion to account for the patient's acute symptoms. No emergent large vessel occlusion. Electronically Signed   By: San Morelle M.D.   On: 11/06/2017 14:37   Ct Head Code Stroke Wo Contrast  Result Date: 11/06/2017 CLINICAL DATA:  Code stroke. Focal neuro deficit, less than 6 hours, stroke suspected. Left-sided weakness. Lethargy. Right-sided facial droop. EXAM: CT HEAD WITHOUT CONTRAST TECHNIQUE: Contiguous axial images were obtained from the base of the skull through the  vertex without intravenous contrast. COMPARISON:  CT head without contrast 06/26/2015 FINDINGS: Brain: No acute infarct, hemorrhage, or mass lesion is present. The ventricles are of normal size. No significant extraaxial fluid collection is present. No significant white matter disease is present. The brainstem and cerebellum are normal. Vascular: No hyperdense vessel or unexpected calcification. Skull: Calvarium is intact. No focal lytic or blastic lesions are present. No significant extracranial soft tissue injury is present. Sinuses/Orbits: Chronic sinus disease  is again noted. Previous sinus surgery is stable. Residual mucosal thickening is present throughout the anterior ethmoid air cells. The frontal sinuses are opacified bilaterally despite previous craniotomy. Bilateral maxillary antrostomies are present. There is a small fluid level in the residual left maxillary sinus. A small fluid level is also present within the left sphenoid sinus. The mastoid air cells are clear. Globes and orbits are within normal limits. Other: None. ASPECTS [facility] Stroke Program Early CT Score) - Ganglionic level infarction (caudate, lentiform nuclei, internal capsule, insula, M1-M3 cortex): 7/7 - Supraganglionic infarction (M4-M6 cortex): 3/3 Total score (0-10 with 10 being normal): 10/10 IMPRESSION: 1. Normal CT appearance of the brain. 2. Extensive chronic sinus disease with previous surgeries. 3. Acute on chronic sinus disease involving the residual left maxillary and sphenoid sinuses. 4. ASPECTS is 10/10 Electronically Signed   By: San Morelle M.D.   On: 11/06/2017 11:06    EKG EKG Interpretation  Date/Time:  Thursday November 06 2017 11:05:57 EDT Ventricular Rate:  83 PR Interval:    QRS Duration: 141 QT Interval:  368 QTC Calculation: 433 R Axis:   -22 Text Interpretation:  Sinus rhythm Ventricular premature complex Right bundle branch block Confirmed by Lajean Saver [phone]) on 11/06/2017 11:20:50  AM   Radiology Ct Head Code Stroke Wo Contrast  Result Date: 11/06/2017 CLINICAL DATA:  Code stroke. Focal neuro deficit, less than 6 hours, stroke suspected. Left-sided weakness. Lethargy. Right-sided facial droop. EXAM: CT HEAD WITHOUT CONTRAST TECHNIQUE: Contiguous axial images were obtained from the base of the skull through the vertex without intravenous contrast. COMPARISON:  CT head without contrast 06/26/2015 FINDINGS: Brain: No acute infarct, hemorrhage, or mass lesion is present. The ventricles are of normal size. No significant extraaxial fluid collection is present. No significant white matter disease is present. The brainstem and cerebellum are normal. Vascular: No hyperdense vessel or unexpected calcification. Skull: Calvarium is intact. No focal lytic or blastic lesions are present. No significant extracranial soft tissue injury is present. Sinuses/Orbits: Chronic sinus disease is again noted. Previous sinus surgery is stable. Residual mucosal thickening is present throughout the anterior ethmoid air cells. The frontal sinuses are opacified bilaterally despite previous craniotomy. Bilateral maxillary antrostomies are present. There is a small fluid level in the residual left maxillary sinus. A small fluid level is also present within the left sphenoid sinus. The mastoid air cells are clear. Globes and orbits are within normal limits. Other: None. ASPECTS [facility] Stroke Program Early CT Score) - Ganglionic level infarction (caudate, lentiform nuclei, internal capsule, insula, M1-M3 cortex): 7/7 - Supraganglionic infarction (M4-M6 cortex): 3/3 Total score (0-10 with 10 being normal): 10/10 IMPRESSION: 1. Normal CT appearance of the brain. 2. Extensive chronic sinus disease with previous surgeries. 3. Acute on chronic sinus disease involving the residual left maxillary and sphenoid sinuses. 4. ASPECTS is 10/10 Electronically Signed   By: San Morelle M.D.   On: 11/06/2017 11:06     Procedures Procedures (including critical care time)  Medications Ordered in ED Medications  iopamidol (ISOVUE-370) 76 % injection (has no administration in time range)  LORazepam (ATIVAN) injection 1 mg (1 mg Intravenous Given 11/06/17 1059)     Initial Impression / Assessment and Plan / ED Course  I have reviewed the triage vital signs and the nursing notes.  Pertinent labs & imaging results that were available during my care of the patient were reviewed by me and considered in my medical decision making (see chart for details).  Iv ns. Labs. Ct.  Reviewed nursing notes and prior charts for additional history.   Neurology evaluated in the ED as a possible stroke - neurology cancelled code stroke activation indicating do not feel is having stroke, and not candidate for tpa or ir.  Neurology recommending additional cva workup, and admission to medical service.  Medicine consulted for admission.  Recheck pt - no focal weakness on exam.   Discussed w Dr Lorin Mercy, hospitalist, she will see/admit.   Final Clinical Impressions(s) / ED Diagnoses   Final diagnoses:  None    ED Discharge Orders    None           Lajean Saver, MD 11/06/17 1600
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
## 2017-11-06 NOTE — ED Notes (Signed)
Neuro MD aware patient unable to receive MRI per MRI team due to patient nerve stimulator

## 2017-11-06 NOTE — Consult Note (Addendum)
NEURO HOSPITALIST    Requesting Physician: Dr. Ashok Cordia    Chief Complaint:  Left side weakness/right facial droop  History obtained from:  Patient  / chart review  HPI:                                                                                                                                         [name] is an 42 y.o. 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 Code stroke was canceled.  Per EMS, this is a known patient to them. He has stutterinh speech difficulty at baseline, but this is worse. EMS stated last known normal was 8 am, however patient states he has been weak since yesterday afternoon. He states he fell yesterday morning 11/05/17 and hit his head on a dog cage. At that time is when he did not feel normal and felt sick. Patient denies losing consciousness at this time. He does state that he was dizzy and had blurry vision in the left eye that began before he fell. Today he woke up and went to the doctor because he was not feeling well.  He arrived at the doctor via a transportation company as patient does not drive. Patient walks with a cane at baseline. Driver helped him to get on the bus this morning. The doctors in the physicians office noted the right facial droop and left arm weakness and called EMS. Patient states that he also is having some chest pain and that he has a horrible HA that he has had the past 8 days. He states " his neurologist gave him gabapentin to take for his HA". He has been taking this over the past 8 days and states that this is not helping. Patient can not recall all of his medications by name, but states that he takes hi BP medication, and his seizure medications and has not missed a dose. Denies, alcohol or drug use. He does report a 10 year smoking history of about 1 pack per week.    He has diagnosied non epileptic spells and cEEG in 2012  captured 5 spells which had no EEG correlated. He sees a neurologist at [facility] for spells and migraine headaches.     ED course:  CT Head that was normal. BP on arrival was 170/100 and his BG was 123.  Date last known well: Date: 11/05/2017 Time last known well: Unable to determine tPA Given: No: contraindicated Modified Rankin: Rankin Score=2  NIHSS: 14 1a. 0 1b 1 1c.  0 2.0 3 1 4 1  5a Motor arm left  2 5b.1 6a  Motor leg left 2 6b.1 7. 0 8 2 9  0 10. 1 11.2   Past Medical History:  Diagnosis Date  . Hypertension   . Seizures ([place])     Past Surgical History:  Procedure Laterality Date  . BRAIN SURGERY    . HERNIA REPAIR    . PROSTATE SURGERY      No family history on file.       Social History:  reports that he has been smoking cigarettes.  He has smoked for the past 10.00 years. He has never used smokeless tobacco. He reports that he does not drink alcohol or use drugs. 1 pack per week.  Allergies:  Allergies  Allergen Reactions  . Asa [Aspirin]   . Ibuprofen   . Nitrofuran Derivatives   . Penicillins   . Tylenol [Acetaminophen]     Medications:                                                                                                                           Current Facility-Administered Medications  Medication Dose Route Frequency Provider Last Rate Last Dose  . iopamidol (ISOVUE-370) 76 % injection            Current Outpatient Medications  Medication Sig Dispense Refill  . albuterol (VENTOLIN HFA) 108 (90 Base) MCG/ACT inhaler Inhale 1 puff into the lungs every 4 (four) hours as needed.    Marland Kitchen allopurinol (ZYLOPRIM) 300 MG tablet Take 300 mg by mouth daily.  2  . atorvastatin (LIPITOR) 20 MG tablet Take 20 mg by mouth daily.  2  . budesonide (PULMICORT) 0.25 MG/2ML nebulizer solution Inhale 2 mLs into the lungs 2 (two) times daily as needed for shortness of breath.    .  diphenhydrAMINE (BENADRYL) 25 mg capsule Take 25 capsules by mouth every 6 (six) hours as needed for allergies.    Marland Kitchen divalproex (DEPAKOTE ER) 500 MG 24 hr tablet Take 1,500 mg by mouth 2 (two) times daily.    . fluticasone (FLONASE) 50 MCG/ACT nasal spray Place 2 sprays into both nostrils daily.  5  . gabapentin (NEURONTIN) 300 MG capsule Take 600 mg by mouth 3 (three) times daily.    Marland Kitchen levETIRAcetam (KEPPRA) 500 MG tablet Take 500-1,000 mg by mouth See admin instructions. Take one tablet (500 mg) in the AM and two tablets (1000mg ) in the evening.  6  . meloxicam (MOBIC) 7.5 MG tablet Take 15 mg by mouth daily as needed for pain.  3  . omeprazole (PRILOSEC) 20 MG capsule Take 20 mg by mouth 2 (two) times daily.  5  . potassium chloride (MICRO-K) 10 MEQ CR capsule Take 10 mEq by mouth 2 (two) times daily.  1  . risperidone (RISPERDAL) 4 MG tablet Take 4 mg by mouth daily.  1  . tamsulosin (FLOMAX) 0.4 MG CAPS capsule Take 0.4 mg by mouth at  bedtime.  1  . triamterene-hydrochlorothiazide (MAXZIDE-25) 37.5-25 MG tablet Take 1 tablet by mouth every morning.  1  . traMADol (ULTRAM) 50 MG tablet Take 1 tablet (50 mg total) by mouth every 6 (six) hours as needed. 15 tablet 0     ROS:                                                                                                                                       History obtained from the patient  General ROS: negative for - chills, fatigue, fever, night sweats, weight gain or weight loss Psychological ROS: negative for - , hallucinations, memory difficulties, mood swings or  Ophthalmic ROS: positive for - blurry vision in left eye Respiratory ROS: negative for - cough,  shortness of breath or wheezing Cardiovascular ROS: Positive for - chest pain,   Musculoskeletal ROS: positive for left arm and leg weakness Neurological ROS: as noted in HPI   General Examination:                                                                                                       Blood pressure (!) 146/86, pulse 73, resp. rate (!) 25, weight 107.4 kg (236 lb 12.4 oz), SpO2 100 %.  HEENT-  Normocephalic, no lesions, without obvious abnormality.  Normal external eye and conjunctiva. Has an old/ healed surgical scar across his head  Cardiovascular- S1-S2 audible, pulses palpable throughout  Lungs-no rhonchi or wheezing noted, no excessive working breathing.  Saturations within normal limits on RA Extremities- Warm, dry and intact Musculoskeletal-left side weakness Skin-warm and dry, intact, right and left leg scar noted.   Neurological Examination Mental Status: Alert, and oriented. Patient has speech impediment at baseline.  No evidence of aphasia. Bradyphrenia noted. Able to follow simple commands. Cranial Nerves: II: Visual fields grossly normal in right eye. Left eye blurry. Does not blink to threat. III,IV, VI: ptosis not present, extra-ocular motions intact bilaterally, pupils equal, round, reactive to light and accommodation V,VII: smile asymmetric, facial light touch abnormal. Patient reports no sensation on left.  VIII: hearing normal bilaterally IX,X: uvula rises symmetrically XI: bilateral shoulder shrug XII: midline tongue extension Motor: Right : Upper extremity   5/5    Left:     Upper extremity   4/5  Lower extremity   5/5     Lower extremity   2/5 Tone and bulk:normal tone throughout; no atrophy noted Sensory:light touch not felt on left side. Right side light  touch intact. Deep Tendon Reflexes:  Bilateral biceps  2    Bilateral tricep 2    Bilateral  Brachioradialis 2  Lower Extremity Lower Extremity  Bilateral quadriceps 2   Bilateral Achilles muted    Plantars: Right: muted   Left: muted Cerebellar: normal finger-to-nose,  Gait: not tested   Lab Results: Basic Metabolic Panel: Recent Labs  Lab 11/06/17 1050  NA 138  K 3.7  CL 104  GLUCOSE 94  BUN 20  CREATININE 0.90    CBC: Recent Labs  Lab  11/06/17 1041 11/06/17 1050  WBC 8.4  --   NEUTROABS 5.1  --   HGB 13.9 15.0  HCT 43.6 44.0  MCV 94.4  --   PLT 248  --     Lipid Panel: No results for input(s): CHOL, TRIG, HDL, CHOLHDL, VLDL, LDLCALC in the last 168 hours.  CBG: No results for input(s): GLUCAP in the last 168 hours.  Imaging: Ct Head Code Stroke Wo Contrast  Result Date: 11/06/2017 CLINICAL DATA:  Code stroke. Focal neuro deficit, less than 6 hours, stroke suspected. Left-sided weakness. Lethargy. Right-sided facial droop. EXAM: CT HEAD WITHOUT CONTRAST TECHNIQUE: Contiguous axial images were obtained from the base of the skull through the vertex without intravenous contrast. COMPARISON:  CT head without contrast 06/26/2015 FINDINGS: Brain: No acute infarct, hemorrhage, or mass lesion is present. The ventricles are of normal size. No significant extraaxial fluid collection is present. No significant white matter disease is present. The brainstem and cerebellum are normal. Vascular: No hyperdense vessel or unexpected calcification. Skull: Calvarium is intact. No focal lytic or blastic lesions are present. No significant extracranial soft tissue injury is present. Sinuses/Orbits: Chronic sinus disease is again noted. Previous sinus surgery is stable. Residual mucosal thickening is present throughout the anterior ethmoid air cells. The frontal sinuses are opacified bilaterally despite previous craniotomy. Bilateral maxillary antrostomies are present. There is a small fluid level in the residual left maxillary sinus. A small fluid level is also present within the left sphenoid sinus. The mastoid air cells are clear. Globes and orbits are within normal limits. Other: None. ASPECTS [facility] Stroke Program Early CT Score) - Ganglionic level infarction (caudate, lentiform nuclei, internal capsule, insula, M1-M3 cortex): 7/7 - Supraganglionic infarction (M4-M6 cortex): 3/3 Total score (0-10 with 10 being normal): 10/10 IMPRESSION: 1.  Normal CT appearance of the brain. 2. Extensive chronic sinus disease with previous surgeries. 3. Acute on chronic sinus disease involving the residual left maxillary and sphenoid sinuses. 4. ASPECTS is 10/10 Electronically Signed   By: San Morelle M.D.   On: 11/06/2017 11:06       Laurey Morale, MSN, NP-C [facility] [phone]  11/06/2017, 11:18 AM   Attending physician note to follow with Assessment and plan .   Assessment: 42 y.o. 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 Has left hemiparesis however with poor effort, appears to have no sensation on left side but grimaces mildly to painful stimuli. Denies any seizure like activity or amnesia.  Looking at chart review - Patient on 07-14-15 presented  left side weakness that was determined to be conversion disorder  CT head was normal.  CTA Head however showed narrowing of bilateral MCA concerning for advanced atherosclerosis vs RCVS vs vasculitis. MRI of brain unable to be performed due to spinal stimulator.   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 I have reviewed the contents of history and physical exam as documented by PA/ARNP/Resident and agree with above documentation.  I have discussed and formulated the  above plan as documented. Edits to the note have been made as needed.    Karena Addison Aroor MD [facility] [phone]   If 7pm to 7am, please call on call as listed on AMION.

## 2017-11-06 NOTE — ED Notes (Signed)
Dr. Lorin Mercy at bedside to assess pt.  When this RN started to hook up his [place] to his left ac pt pick up his left arm turned it from side to side and then reached over and touched his right arm, while explaining that his left IV did not work.  When this RN commented that he was using his left arm now, pt replied that no he could not lift his arm.  Dr. Lorin Mercy was aware of purposeful movement of left arm.  Pt st's "it comes and goes".
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
## 2017-11-06 NOTE — Progress Notes (Signed)
Patient NPO. Ordered Keppra, and depakote. Patient also requesting something for pain. Paged on call with [facility] [place]. Awaiting order.

## 2017-11-07 ENCOUNTER — Observation Stay (HOSPITAL_COMMUNITY): Payer: Medicare Other

## 2017-11-07 DIAGNOSIS — F445 Conversion disorder with seizures or convulsions: Secondary | ICD-10-CM

## 2017-11-07 DIAGNOSIS — Z736 Limitation of activities due to disability: Secondary | ICD-10-CM

## 2017-11-07 DIAGNOSIS — R279 Unspecified lack of coordination: Secondary | ICD-10-CM | POA: Diagnosis not present

## 2017-11-07 DIAGNOSIS — F17210 Nicotine dependence, cigarettes, uncomplicated: Secondary | ICD-10-CM

## 2017-11-07 DIAGNOSIS — Z818 Family history of other mental and behavioral disorders: Secondary | ICD-10-CM | POA: Diagnosis not present

## 2017-11-07 DIAGNOSIS — R2990 Unspecified symptoms and signs involving the nervous system: Secondary | ICD-10-CM

## 2017-11-07 DIAGNOSIS — F3163 Bipolar disorder, current episode mixed, severe, without psychotic features: Secondary | ICD-10-CM | POA: Diagnosis not present

## 2017-11-07 DIAGNOSIS — R531 Weakness: Secondary | ICD-10-CM | POA: Diagnosis not present

## 2017-11-07 DIAGNOSIS — Z743 Need for continuous supervision: Secondary | ICD-10-CM | POA: Diagnosis not present

## 2017-11-07 DIAGNOSIS — I10 Essential (primary) hypertension: Secondary | ICD-10-CM | POA: Diagnosis not present

## 2017-11-07 DIAGNOSIS — R29810 Facial weakness: Secondary | ICD-10-CM | POA: Diagnosis not present

## 2017-11-07 LAB — RAPID URINE DRUG SCREEN, HOSP PERFORMED
AMPHETAMINES: NOT DETECTED
Benzodiazepines: NOT DETECTED
Cocaine: NOT DETECTED
OPIATES: NOT DETECTED
Tetrahydrocannabinol: NOT DETECTED

## 2017-11-07 LAB — LIPID PANEL
CHOL/HDL RATIO: 2.7 ratio
CHOLESTEROL: 79 mg/dL (ref 0–200)
HDL: 29 mg/dL — ABNORMAL LOW (ref >40)
LDL CALC: 28 mg/dL (ref 0–99)
Triglycerides: 112 mg/dL (ref <150)
VLDL: 22 mg/dL (ref 0–40)

## 2017-11-07 LAB — HIV ANTIBODY (ROUTINE TESTING W REFLEX): HIV Screen 4th Generation wRfx: NONREACTIVE

## 2017-11-07 NOTE — Care Management Note (Addendum)
Case Management Note  Patient Details  Name: [name] MRN: [id number] Date of Birth: 1975-12-14  Subjective/Objective:     Pt admitted with neurologic symptoms. He is from home with his brother.                Action/Plan: Pt with orders for [facility] services. CM provided him choice and he selected [facility]. Dorian Pod with [facility] notified and accepted the referral.  Pt asking for transportation home. CM will arrange for [facility] to transport him home one d/c order is placed.   Addendum ([id number]): CM set up transport home for 5:30 with [facility]. Transport form on the front of the chart. Bedside RN updated.   Expected Discharge Date:                  Expected Discharge Plan:  [place]  In-House Referral:     Discharge planning Services  CM Consult, [place]  Post Acute Care Choice:    Choice offered to:  Patient  DME Arranged:    DME Agency:     HH Arranged:  PT, OT HH Agency:  [facility]  Status of Service:  Completed, signed off  If discussed at [place] of Stay Meetings, dates discussed:    Additional Comments:  Pollie Friar, RN 11/07/2017, 2:45 PM
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
## 2017-11-07 NOTE — Evaluation (Signed)
Physical Therapy Evaluation Patient Details Name: [name] MRN: [id number] DOB: 1975/12/23 Today's Date: 11/07/2017   History of Present Illness  Patient is a 42 y/o male presenting with slurred speeach, blurry vision, L sided weakness. Imaging negative workup. Patient with a PMH significant for nonepileptic seizures; HTN; COPD; DM; bipolar; and OSA.  Clinical Impression  Patient admitted for stroke work-up. Prior to admission patient was Mod I with mobility with SPC. Patient today very inconsistent throughout session. Initially stating he could not move L UE/LE at all, but able to intentionally move L side with mobility and during times when attention was away from deficits. Able to transfer and ambulate short distance within room with RW without episode of knee buckling or LOB with only Min guard for general safety. Able to demonstrate controlled sit to recliner without physical assist. PT to continue to follow acutely to determine deficits and progress functional mobility.     Follow Up Recommendations Home health PT;Supervision/Assistance - 24 hour    Equipment Recommendations  None recommended by PT    Recommendations for Other Services       Precautions / Restrictions Precautions Precautions: Fall Restrictions Weight Bearing Restrictions: No      Mobility  Bed Mobility Overal bed mobility: Needs Assistance Bed Mobility: Supine to Sit     Supine to sit: Min guard     General bed mobility comments: for safety and stability; very quick movements  Transfers Overall transfer level: Needs assistance Equipment used: Rolling walker (2 wheeled) Transfers: Sit to/from [facility] Sit to Stand: Min guard Stand pivot transfers: Min guard       General transfer comment: Min guard for safety; no physical assistance for balance, no L knee buckling during transfer to recliner   Ambulation/Gait Ambulation/Gait assistance: Min guard;+2 safety/equipment Gait  Distance (Feet): 10 Feet Assistive device: Rolling walker (2 wheeled) Gait Pattern/deviations: Step-to pattern;Decreased stride length;Decreased weight shift to left Gait velocity: decreased   General Gait Details: limited hip/knee flexion at L LE, prefers to drag L LE; no episodes of balance or LOB with only Min guard for short ambulation distance to Science writer    Modified Rankin (Stroke Patients Only)       Balance Overall balance assessment: Needs assistance Sitting-balance support: Feet supported Sitting balance-Leahy Scale: Good     Standing balance support: Bilateral upper extremity supported;During functional activity Standing balance-Leahy Scale: Fair Standing balance comment: use of RW                             Pertinent Vitals/Pain Pain Assessment: No/denies pain    Home Living Family/patient expects to be discharged to:: Private residence Living Arrangements: Other relatives(brother) Available Help at Discharge: Family Type of Home: House Home Access: Ramped entrance     Home Layout: One level Home Equipment: Cane - single point Additional Comments: reports ramp is "not too good"    Prior Function Level of Independence: Independent with assistive device(s)               Hand Dominance   Dominant Hand: Right    Extremity/Trunk Assessment   Upper Extremity Assessment Upper Extremity Assessment: Defer to OT evaluation LUE Deficits / Details: difficult to assess. When asked to follow commands for strength testing pt exhibits deficts. However, pt able to utilize UE for functional tasks during session when attention  not brought to it.     Lower Extremity Assessment Lower Extremity Assessment: LLE deficits/detail LLE Deficits / Details: reports inablity to intentionally move L LE, however noted intentional movement during functional mobility when not giving direct attention to deficits        Communication   Communication: Other (comment)(speech inconsistent )  Cognition Arousal/Alertness: Awake/alert Behavior During Therapy: Restless;Anxious Overall Cognitive Status: No family/caregiver present to determine baseline cognitive functioning                                 General Comments: Pt appearing very anxious regarding therapeutic intervention. Pt states multiple times, ' My L side is numb and I can't do any of this."      General Comments      Exercises     Assessment/Plan    PT Assessment Patient needs continued PT services  PT Problem List Decreased strength;Decreased activity tolerance;Decreased balance;Decreased mobility;Decreased knowledge of use of DME;Decreased safety awareness;Decreased knowledge of precautions       PT Treatment Interventions DME instruction;Gait training;Stair training;Functional mobility training;Therapeutic activities;Therapeutic exercise;Balance training;Neuromuscular re-education;Cognitive remediation;Patient/family education    PT Goals (Current goals can be found in the Care Plan section)  Acute Rehab PT Goals Patient Stated Goal: none stated PT Goal Formulation: With patient Time For Goal Achievement: 11/14/17 Potential to Achieve Goals: Good    Frequency Min 3X/week   Barriers to discharge        Co-evaluation PT/OT/SLP Co-Evaluation/Treatment: Yes Reason for Co-Treatment: Complexity of the patient's impairments (multi-system involvement);For patient/therapist safety PT goals addressed during session: Mobility/safety with mobility OT goals addressed during session: ADL's and self-care;Proper use of Adaptive equipment and DME       AM-PAC PT "6 Clicks" Daily Activity  Outcome Measure Difficulty turning over in bed (including adjusting bedclothes, sheets and blankets)?: A Little Difficulty moving from lying on back to sitting on the side of the bed? : A Little Difficulty sitting down on and standing up  from a chair with arms (e.g., wheelchair, bedside commode, etc,.)?: Unable Help needed moving to and from a bed to chair (including a wheelchair)?: A Little Help needed walking in hospital room?: A Little Help needed climbing 3-5 steps with a railing? : A Lot 6 Click Score: 15    End of Session Equipment Utilized During Treatment: Gait belt Activity Tolerance: Patient tolerated treatment well Patient left: in chair;with call bell/phone within reach;with chair alarm set Nurse Communication: Mobility status PT Visit Diagnosis: Unsteadiness on feet (R26.81);Other abnormalities of gait and mobility (R26.89);Muscle weakness (generalized) (M62.81)    Time: 8250-5397 PT Time Calculation (min) (ACUTE ONLY): 24 min   Charges:   PT Evaluation $PT Eval Moderate Complexity: 1 Mod     PT G Codes:        Lanney Gins, PT, DPT 11/07/17 1:30 PM
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
## 2017-11-07 NOTE — Progress Notes (Signed)
Modified Barium Swallow Progress Note  Patient Details  Name: [name] MRN: [id number] Date of Birth: July 10, 1975  Today's Date: 11/07/2017  Modified Barium Swallow completed.  Full report located under Chart Review in the Imaging Section.  Brief recommendations include the following:  Clinical Impression  MBS was completed using thin liquids, pureed material and solids in setting for admission due to possible CVA.  The patient presented wtih a mild oral dysphagia and functional pharyngeal swallow.  The oral phase was marked by good lingual control during tongue hold with thin liquids and functional/timely mastication of solids.  Despite good lingual control during bolus hold the patient was observed to hold material in the front of his mouth with all textures with attempts to manipulate it in the front of his mouth.  This led to the appearance of disorganization.  He piecemealed almost every bolus leaving a small bit of all tested textures in his oral cavity which he was able to recollect and clear.  No premature loss of the bolus was seen.  The pharyngeal phase of the swallow was functional.  Patient was noted to cough/gag while material was in his mouth stating that went down the wrong way but no penetration/aspiration was seen.  Recommend a regular diet with thin liquids.  ST follow up is not indicated.     Swallow Evaluation Recommendations       SLP Diet Recommendations: Regular solids;Thin liquid   Liquid Administration via: Straw;Cup   Medication Administration: Whole meds with liquid(consider crushing in purees if patient desires)   Supervision: Patient able to self feed   Oral Care Recommendations: Oral care BID       Shelly Flatten, MA, CCC-SLP Acute Rehab SLP [phone]  Lamar Sprinkles 11/07/2017,10:41 AM

## 2017-11-07 NOTE — Progress Notes (Signed)
Patient was seen and examined Starts out with slightly dysarthric speech-but then speech becomes fluent. When distracted moving his left upper and left lower extremity.  I informed him that he would be going home today-then asked for [place] to arrange a ride home, and then asked if he could the next meal and then go home.  When I said yes to this, this patient sat up in bed, and shook my hand with his left hand.  While doing this, he was moving his left lower extremity as well.  Spoke with neurology-recommendations are to repeat a CT head before we discharge this patient home.  Most likely CT head will not show any acute findings, following which this patient will be discharged home.  As noted above noted by numerous prior physicians, and also by physical therapy-his exam is clearly inconsistent-and his clinical symptomatology are more suggestive of a conversion disorder.

## 2017-11-07 NOTE — Progress Notes (Signed)
Pt discharged to home in stable condition, all discharge instructions reviewed with and given to pt.  No prescriptions written, pt already has all meds.  All belongings packed, awaiting PTAR for transport.  [id number]

## 2017-11-07 NOTE — Progress Notes (Signed)
Reason for consult:   Subjective: Patient stillcomplains of weakness in left arm and left leg, headache. However, multiple instances noted since yesterday by PT, physician and nurse where he moved his left arm antigravity.    ROS: negative except above   Examination  Vital signs in last 24 hours: Temp:  [97.6 F (36.4 C)-97.9 F (36.6 C)] 97.8 F (36.6 C) (06/21 1234) Pulse Rate:  [61-88] 88 (06/21 1234) Resp:  [16-20] 16 (06/21 1234) BP: (115-130)/(68-86) 117/76 (06/21 1234) SpO2:  [96 %-99 %] 98 % (06/21 1234) Weight:  [103.6 kg (228 lb 6.3 oz)] 103.6 kg (228 lb 6.3 oz) (06/20 1900)  General: Not in distress, cooperative CVS: pulse-normal rate and rhythm RS: breathing comfortably Extremities: normal   Neuro: MS: Alert, oriented, follows commands CN: pupils equal and reactive,  EOMI, face symmetric, tongue midline, normal sensation over face, Motor: 5/5 strength in right arm leg, 2/5 in left arm ( poor effort) and left leg ( poor effort)  Coordination: normal Gait: not tested  Basic Metabolic Panel: Recent Labs  Lab 11/06/17 1041 11/06/17 1050  NA 139 138  K 3.9 3.7  CL 106 104  CO2 22  --   GLUCOSE 98 94  BUN 19 20  CREATININE 1.11 0.90  CALCIUM 9.8  --     CBC: Recent Labs  Lab 11/06/17 1041 11/06/17 1050  WBC 8.4  --   NEUTROABS 5.1  --   HGB 13.9 15.0  HCT 43.6 44.0  MCV 94.4  --   PLT 248  --      Coagulation Studies: Recent Labs    11/06/17 1041  LABPROT 13.1  INR 1.00    Imaging Reviewed:     ASSESSMENT AND PLAN 42 y.o. 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 Has left hemiparesis however with poor effort, appears to have no sensation on left side but grimaces mildly to painful stimuli. Denies any seizure like activity or amnesia. Has past episodes ( 2-24-170 presented  left side weakness that was  determined to be conversion disorder.CT head was negative for acute infarct.  Unable to obtain MRI Aaron Edelman this time due to spinal stimulator.  CTA Head however showed narrowing of bilateral MCA concerning for advanced atherosclerosis vs RCVS vs vasculitis.  CTA findings most likely intracranial atherosclerosis as patient is obese, has uncontrolled HTN and past smoker. UDS negative. Autoimmune labs ordered. ESR and CRP normal.   Patient received migraine cocktail, VPA bolus for headache - apparently did not help but also patient does not appear in distress.     

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
## 2017-11-07 NOTE — Care Management Obs Status (Signed)
[facility] NOTIFICATION   Patient Details  Name: [name] MRN: [id number] Date of Birth: 05-Oct-1975   Medicare Observation Status Notification Given:  Yes    Pollie Friar, RN 11/07/2017, 2:25 PM

## 2017-11-08 LAB — MPO/PR-3 (ANCA) ANTIBODIES: ANCA Proteinase 3: <3.5 U/mL (ref 0.0–3.5)

## 2017-11-08 LAB — ANA: Anti Nuclear Antibody(ANA): NEGATIVE

## 2017-11-11 LAB — ANCA TITERS: C-ANCA: <1:20 {titer}

## 2017-12-01 DIAGNOSIS — R0600 Dyspnea, unspecified: Secondary | ICD-10-CM | POA: Diagnosis not present

## 2017-12-01 DIAGNOSIS — R0609 Other forms of dyspnea: Secondary | ICD-10-CM | POA: Diagnosis not present

## 2017-12-01 DIAGNOSIS — F329 Major depressive disorder, single episode, unspecified: Secondary | ICD-10-CM | POA: Diagnosis not present

## 2017-12-01 DIAGNOSIS — R0689 Other abnormalities of breathing: Secondary | ICD-10-CM | POA: Diagnosis not present

## 2017-12-01 DIAGNOSIS — E119 Type 2 diabetes mellitus without complications: Secondary | ICD-10-CM | POA: Diagnosis not present

## 2017-12-01 DIAGNOSIS — F17210 Nicotine dependence, cigarettes, uncomplicated: Secondary | ICD-10-CM | POA: Diagnosis not present

## 2017-12-01 DIAGNOSIS — J80 Acute respiratory distress syndrome: Secondary | ICD-10-CM | POA: Diagnosis not present

## 2017-12-01 DIAGNOSIS — I10 Essential (primary) hypertension: Secondary | ICD-10-CM | POA: Diagnosis not present

## 2018-01-15 DIAGNOSIS — R109 Unspecified abdominal pain: Secondary | ICD-10-CM | POA: Diagnosis not present

## 2018-01-15 DIAGNOSIS — Z23 Encounter for immunization: Secondary | ICD-10-CM | POA: Diagnosis not present

## 2018-01-15 DIAGNOSIS — G8929 Other chronic pain: Secondary | ICD-10-CM | POA: Diagnosis not present

## 2018-01-15 DIAGNOSIS — D649 Anemia, unspecified: Secondary | ICD-10-CM | POA: Diagnosis not present

## 2018-01-15 DIAGNOSIS — F17210 Nicotine dependence, cigarettes, uncomplicated: Secondary | ICD-10-CM | POA: Diagnosis not present

## 2018-01-15 DIAGNOSIS — I10 Essential (primary) hypertension: Secondary | ICD-10-CM | POA: Diagnosis not present

## 2018-01-15 DIAGNOSIS — R0602 Shortness of breath: Secondary | ICD-10-CM | POA: Diagnosis not present

## 2018-01-15 DIAGNOSIS — M87051 Idiopathic aseptic necrosis of right femur: Secondary | ICD-10-CM | POA: Diagnosis not present

## 2018-01-15 DIAGNOSIS — I4510 Unspecified right bundle-branch block: Secondary | ICD-10-CM | POA: Diagnosis not present

## 2018-01-15 DIAGNOSIS — F319 Bipolar disorder, unspecified: Secondary | ICD-10-CM | POA: Diagnosis not present

## 2018-01-15 DIAGNOSIS — R296 Repeated falls: Secondary | ICD-10-CM | POA: Diagnosis not present

## 2018-01-15 DIAGNOSIS — R079 Chest pain, unspecified: Secondary | ICD-10-CM | POA: Diagnosis not present

## 2018-01-15 DIAGNOSIS — Z8673 Personal history of transient ischemic attack (TIA), and cerebral infarction without residual deficits: Secondary | ICD-10-CM | POA: Diagnosis not present

## 2018-01-15 DIAGNOSIS — J449 Chronic obstructive pulmonary disease, unspecified: Secondary | ICD-10-CM | POA: Diagnosis not present

## 2018-01-15 DIAGNOSIS — R0789 Other chest pain: Secondary | ICD-10-CM | POA: Diagnosis not present

## 2018-01-15 DIAGNOSIS — J80 Acute respiratory distress syndrome: Secondary | ICD-10-CM | POA: Diagnosis not present

## 2018-01-15 DIAGNOSIS — K219 Gastro-esophageal reflux disease without esophagitis: Secondary | ICD-10-CM | POA: Diagnosis not present

## 2018-01-15 DIAGNOSIS — I6789 Other cerebrovascular disease: Secondary | ICD-10-CM | POA: Diagnosis not present

## 2018-01-15 DIAGNOSIS — R110 Nausea: Secondary | ICD-10-CM | POA: Diagnosis not present

## 2018-01-15 DIAGNOSIS — G4089 Other seizures: Secondary | ICD-10-CM | POA: Diagnosis not present

## 2018-01-15 DIAGNOSIS — F419 Anxiety disorder, unspecified: Secondary | ICD-10-CM | POA: Diagnosis not present

## 2018-01-15 DIAGNOSIS — M109 Gout, unspecified: Secondary | ICD-10-CM | POA: Diagnosis not present

## 2018-01-15 DIAGNOSIS — N400 Enlarged prostate without lower urinary tract symptoms: Secondary | ICD-10-CM | POA: Diagnosis not present

## 2018-01-15 DIAGNOSIS — E785 Hyperlipidemia, unspecified: Secondary | ICD-10-CM | POA: Diagnosis not present

## 2018-01-16 DIAGNOSIS — G8929 Other chronic pain: Secondary | ICD-10-CM | POA: Diagnosis not present

## 2018-01-16 DIAGNOSIS — R079 Chest pain, unspecified: Secondary | ICD-10-CM | POA: Diagnosis not present

## 2018-01-16 DIAGNOSIS — F319 Bipolar disorder, unspecified: Secondary | ICD-10-CM | POA: Diagnosis not present

## 2018-01-16 DIAGNOSIS — I10 Essential (primary) hypertension: Secondary | ICD-10-CM | POA: Diagnosis not present

## 2018-01-16 DIAGNOSIS — R109 Unspecified abdominal pain: Secondary | ICD-10-CM | POA: Diagnosis not present

## 2018-01-20 DIAGNOSIS — I4519 Other right bundle-branch block: Secondary | ICD-10-CM | POA: Diagnosis not present

## 2018-01-20 DIAGNOSIS — R0683 Snoring: Secondary | ICD-10-CM | POA: Diagnosis not present

## 2018-01-20 DIAGNOSIS — R079 Chest pain, unspecified: Secondary | ICD-10-CM | POA: Diagnosis not present

## 2018-01-20 DIAGNOSIS — R531 Weakness: Secondary | ICD-10-CM | POA: Diagnosis not present

## 2018-02-01 ENCOUNTER — Other Ambulatory Visit: Payer: Self-pay

## 2018-02-01 ENCOUNTER — Emergency Department (HOSPITAL_COMMUNITY): Payer: Medicare Other

## 2018-02-01 ENCOUNTER — Emergency Department (HOSPITAL_COMMUNITY)
Admission: EM | Admit: 2018-02-01 | Discharge: 2018-02-01 | Disposition: A | Discharge status: Home / Self Care | Payer: Medicare Other | Attending: Emergency Medicine | Admitting: Emergency Medicine

## 2018-02-01 DIAGNOSIS — F445 Conversion disorder with seizures or convulsions: Secondary | ICD-10-CM | POA: Diagnosis not present

## 2018-02-01 DIAGNOSIS — I10 Essential (primary) hypertension: Secondary | ICD-10-CM | POA: Insufficient documentation

## 2018-02-01 DIAGNOSIS — F17210 Nicotine dependence, cigarettes, uncomplicated: Secondary | ICD-10-CM | POA: Diagnosis not present

## 2018-02-01 DIAGNOSIS — G4089 Other seizures: Secondary | ICD-10-CM | POA: Diagnosis not present

## 2018-02-01 DIAGNOSIS — R0689 Other abnormalities of breathing: Secondary | ICD-10-CM | POA: Diagnosis not present

## 2018-02-01 DIAGNOSIS — Z79899 Other long term (current) drug therapy: Secondary | ICD-10-CM | POA: Insufficient documentation

## 2018-02-01 DIAGNOSIS — J449 Chronic obstructive pulmonary disease, unspecified: Secondary | ICD-10-CM | POA: Diagnosis not present

## 2018-02-01 DIAGNOSIS — G40909 Epilepsy, unspecified, not intractable, without status epilepticus: Secondary | ICD-10-CM | POA: Diagnosis present

## 2018-02-01 DIAGNOSIS — R0902 Hypoxemia: Secondary | ICD-10-CM | POA: Diagnosis not present

## 2018-02-01 DIAGNOSIS — R51 Headache: Secondary | ICD-10-CM | POA: Diagnosis not present

## 2018-02-01 DIAGNOSIS — R569 Unspecified convulsions: Secondary | ICD-10-CM | POA: Diagnosis not present

## 2018-02-01 LAB — VALPROIC ACID LEVEL: Valproic Acid Lvl: 57 ug/mL (ref 50.0–100.0)

## 2018-02-01 LAB — RAPID URINE DRUG SCREEN, HOSP PERFORMED
Amphetamines: NOT DETECTED
Barbiturates: NOT DETECTED
Benzodiazepines: POSITIVE — AB
Cocaine: NOT DETECTED
OPIATES: NOT DETECTED
Tetrahydrocannabinol: NOT DETECTED

## 2018-02-01 LAB — CBC WITH DIFFERENTIAL/PLATELET
Abs Immature Granulocytes: 0 10*3/uL (ref 0.0–0.1)
Basophils Absolute: 0.1 10*3/uL (ref 0.0–0.1)
Basophils Relative: 1 %
EOS PCT: 1 %
Eosinophils Absolute: 0.1 10*3/uL (ref 0.0–0.7)
HEMATOCRIT: 39.4 % (ref 39.0–52.0)
Hemoglobin: 12.5 g/dL — ABNORMAL LOW (ref 13.0–17.0)
Immature Granulocytes: 0 %
LYMPHS ABS: 1.9 10*3/uL (ref 0.7–4.0)
Lymphocytes Relative: 27 %
MCH: 27.5 pg (ref 26.0–34.0)
MCHC: 31.7 g/dL (ref 30.0–36.0)
MCV: 86.8 fL (ref 78.0–100.0)
Monocytes Absolute: 0.6 10*3/uL (ref 0.1–1.0)
Monocytes Relative: 8 %
Neutro Abs: 4.5 10*3/uL (ref 1.7–7.7)
Neutrophils Relative %: 63 %
Platelets: 311 10*3/uL (ref 150–400)
RBC: 4.54 MIL/uL (ref 4.22–5.81)
RDW: 13.7 % (ref 11.5–15.5)
WBC: 7.2 10*3/uL (ref 4.0–10.5)

## 2018-02-01 LAB — BASIC METABOLIC PANEL
Anion gap: 14 (ref 5–15)
BUN: 20 mg/dL (ref 6–20)
CO2: 20 mmol/L — ABNORMAL LOW (ref 22–32)
CREATININE: 1.12 mg/dL (ref 0.61–1.24)
Calcium: 9.5 mg/dL (ref 8.9–10.3)
Chloride: 105 mmol/L (ref 98–111)
GFR calc Af Amer: >60 mL/min (ref >60)
Glucose, Bld: 88 mg/dL (ref 70–99)
Potassium: 3.6 mmol/L (ref 3.5–5.1)
SODIUM: 139 mmol/L (ref 135–145)

## 2018-02-01 LAB — URINALYSIS, ROUTINE W REFLEX MICROSCOPIC
BACTERIA UA: NONE SEEN
Bilirubin Urine: NEGATIVE
Glucose, UA: NEGATIVE mg/dL
KETONES UR: NEGATIVE mg/dL
LEUKOCYTES UA: NEGATIVE
NITRITE: NEGATIVE
PROTEIN: NEGATIVE mg/dL
SPECIFIC GRAVITY, URINE: 1.02 (ref 1.005–1.030)
pH: 7 (ref 5.0–8.0)

## 2018-02-01 LAB — CBG MONITORING, ED: GLUCOSE-CAPILLARY: 125 mg/dL — AB (ref 70–99)

## 2018-02-01 MED ORDER — LORAZEPAM 2 MG/ML IJ SOLN
INTRAMUSCULAR | Status: AC
  Filled 2018-02-01: qty 1

## 2018-02-01 MED ORDER — LEVETIRACETAM IN NACL 1000 MG/100ML IV SOLN
1000.0000 mg | Freq: Once | INTRAVENOUS | Status: AC
  Administered 2018-02-01: 1000 mg via INTRAVENOUS
  Filled 2018-02-01: qty 100

## 2018-02-01 MED ORDER — LORAZEPAM 2 MG/ML IJ SOLN
INTRAMUSCULAR | Status: AC
  Administered 2018-02-01: 2 mg
  Filled 2018-02-01: qty 1

## 2018-02-01 NOTE — ED Notes (Signed)
Patient transported to CT 

## 2018-02-01 NOTE — ED Notes (Signed)
Family called staff in r/t pt having seizure activity. Last ed approx 5 mins, 2mg  ativan given IV. PA, MD, DN 2RNs bedside for efaluation. Pt no longner seizing at this time. Pt did not experience hypoxia or incontinence during seizure-like activity.

## 2018-02-01 NOTE — ED Notes (Signed)
Pt and family aware of need for urine sample, urinal at bedside 
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
## 2018-02-01 NOTE — Consult Note (Signed)
Neurology Consultation  Reason for Consult: Seizure activity Referring Physician: Dr. Ronnald Nian  CC: Seizure  History is obtained from: Patient, girlfriend, chart HPI: [name] is a 42 y.o. male past medical history of bipolar disorder, pseudoseizures and headaches presented for multiple seizures today. He was with his girlfriend ended 2 seizures while in his girlfriend's car. Is brought into the emergency room where he had another seizure that lasted about 5 minutes witnessed by the nurses.  There was no hypoxia or incontinence noted during this event and it subsided after he received Ativan. He has a well-documented history of pseudoseizures and the description of the seizures today also with whole body starting to shake with some thrusting is consistent with his pseudoseizure history. He is coming around and is able to provide most of the history.  Upon asking his girlfriend to leave the room because he had only known her for 2 weeks and I was not sure if she is aware with a psychiatric history, I took his permission before getting his call from back in the room and discussed things in detail.  While his girlfriend was out, he was able to share with me that he has been extremely stressed, got into argument with some family member as well as is just generally stressed and feels that his seizures do come on with stress. No preceding illnesses or sicknesses.  ROS: ROS was performed and is negative except as noted in the HPI  Past Medical History:  Diagnosis Date  . COPD (chronic obstructive pulmonary disease) ([place])   . Hypertension   . Seizures ([place])    Family History  Problem Relation Age of Onset  . Stroke Neg Hx    Social History:   reports that he has been smoking cigarettes. He has a 10.00 pack-year smoking history. He has never used smokeless tobacco. He reports that he does not drink alcohol or use drugs. Smokes tobacco but does not abuse illicit drugs or  alcohol Medications  Current Facility-Administered Medications:  .  LORazepam (ATIVAN) 2 MG/ML injection, , , ,   Current Outpatient Medications:  .  albuterol (VENTOLIN HFA) 108 (90 Base) MCG/ACT inhaler, Inhale 1 puff into the lungs every 4 (four) hours as needed., Disp: , Rfl:  .  allopurinol (ZYLOPRIM) 300 MG tablet, Take 300 mg by mouth daily., Disp: , Rfl: 2 .  atorvastatin (LIPITOR) 20 MG tablet, Take 20 mg by mouth daily., Disp: , Rfl: 2 .  budesonide (PULMICORT) 0.25 MG/2ML nebulizer solution, Inhale 2 mLs into the lungs 2 (two) times daily as needed for shortness of breath., Disp: , Rfl:  .  divalproex (DEPAKOTE ER) 500 MG 24 hr tablet, Take 1,500 mg by mouth 2 (two) times daily., Disp: , Rfl:  .  fluticasone (FLONASE) 50 MCG/ACT nasal spray, Place 2 sprays into both nostrils daily., Disp: , Rfl: 5 .  gabapentin (NEURONTIN) 300 MG capsule, Take 600 mg by mouth 3 (three) times daily., Disp: , Rfl:  .  levETIRAcetam (KEPPRA) 500 MG tablet, Take 500-1,000 mg by mouth See admin instructions. Take one tablet (500 mg) in the AM and two tablets (1000mg ) in the evening., Disp: , Rfl: 6 .  meloxicam (MOBIC) 7.5 MG tablet, Take 15 mg by mouth daily as needed for pain., Disp: , Rfl: 3 .  omeprazole (PRILOSEC) 20 MG capsule, Take 20 mg by mouth 2 (two) times daily., Disp: , Rfl: 5 .  potassium chloride (MICRO-K) 10 MEQ CR capsule, Take 10 mEq by mouth  2 (two) times daily., Disp: , Rfl: 1 .  risperidone (RISPERDAL) 4 MG tablet, Take 4 mg by mouth daily., Disp: , Rfl: 1 .  tamsulosin (FLOMAX) 0.4 MG CAPS capsule, Take 0.4 mg by mouth at bedtime., Disp: , Rfl: 1 .  triamterene-hydrochlorothiazide (MAXZIDE-25) 37.5-25 MG tablet, Take 1 tablet by mouth every morning., Disp: , Rfl: 1  Exam: Current vital signs: SpO2 98%  Vital signs in last 24 hours: SpO2:  [98 %] 98 % (31/54 0086) Systolic blood pressure 761 diastolic 85, heart rate 90 General: Patient appears sleepy, easily awakes to voice, no  distress HEENT: - Normocephalic and atraumatic, dry mm, no LN++, no Thyromegally, edentulous LUNGS - Clear to auscultation bilaterally with no wheezes CV - S1S2 RRR, no m/r/g, equal pulses bilaterally. ABDOMEN - Soft, nontender, nondistended with normoactive BS Ext: warm, well perfused, intact peripheral pulses,   NEURO:  Mental Status: AA&Ox3  Language: speech is mildly dysarthric.  Naming, repetition, fluency, and comprehension intact Cranial Nerves: PERRL EOMI, visual fields full, no facial asymmetry, facial sensation intact, hearing intact, tongue/uvula/soft palate midline, normal sternocleidomastoid and trapezius muscle strength. No evidence of tongue atrophy or fibrillations Motor: Antigravity limited by pain grossly symmetric in all fours Tone: is normal and bulk is normal Sensation- Intact to light touch bilaterally Coordination: FTN intact bilaterally Gait- deferred  NIHSS-0   Labs I have reviewed labs in epic and the results pertinent to this consultation are:  cBC    Component Value Date/Time   WBC 7.2 02/01/2018 1404   RBC 4.54 02/01/2018 1404   HGB 12.5 (L) 02/01/2018 1404   HCT 39.4 02/01/2018 1404   PLT 311 02/01/2018 1404   MCV 86.8 02/01/2018 1404   MCH 27.5 02/01/2018 1404   MCHC 31.7 02/01/2018 1404   RDW 13.7 02/01/2018 1404   LYMPHSABS 1.9 02/01/2018 1404   MONOABS 0.6 02/01/2018 1404   EOSABS 0.1 02/01/2018 1404   BASOSABS 0.1 02/01/2018 1404    CMP     Component Value Date/Time   NA 139 02/01/2018 1404   K 3.6 02/01/2018 1404   CL 105 02/01/2018 1404   CO2 20 (L) 02/01/2018 1404   GLUCOSE 88 02/01/2018 1404   BUN 20 02/01/2018 1404   CREATININE 1.12 02/01/2018 1404   CALCIUM 9.5 02/01/2018 1404   PROT 6.9 11/06/2017 1041   ALBUMIN 3.8 11/06/2017 1041   AST 27 11/06/2017 1041   ALT 26 11/06/2017 1041   ALKPHOS 58 11/06/2017 1041   BILITOT 0.6 11/06/2017 1041   GFRNONAA >60 02/01/2018 1404   GFRAA >60 02/01/2018 1404  Imaging I have  reviewed the images obtained: Noncontrast CT of the head  Assessment:  42 year old man with history of bipolar disorder, hypertension, pseudoseizures well documented in his chart presented for multiple seizure episodes including one in the ED.  He received 1 dose of Ativan and a load with Keppra 1 g IV x1. At home he is on antiepileptic medications for the pseudoseizures per his outpatient neurologist. I discussed in detail with him about the instances happening prior to his presentation he said that he probably had his classical "seizure" that he gets with stress. Further discussion with him reveals that he has had the seizures always and stress.  He has documented pseudoseizures history. He has been seen at our facility in the past again with pseudoseizures. I do not see the utility of admitting him unless there is something abnormal on his brain imaging or he has any lab abnormalities that  need to be addressed. He is agreeable with the plan and will follow up with his outpatient therapist.  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 At the seizure precautions documented below.   Per [facility] statutes, patients with seizures are not allowed to drive until they have been seizure-free for six months.   SEIZURE PRECAUTIONS Use caution when using heavy equipment or power tools. Avoid working on ladders or at heights. Take showers instead of baths. Ensure the water temperature is not too high on the home water heater. Do not go swimming alone. Do not lock yourself in a room alone (i.e. bathroom). When caring for  infants or small children, sit down when holding, feeding, or changing them to minimize risk of injury to the child in the event you have a seizure. Maintain good sleep hygiene. Avoid alcohol.   If patient has another seizure, call 911 and bring them back to the ED if: A. The seizure lasts longer than 5 minutes.  B. The patient doesn't wake shortly after the seizure or has new problems such as difficulty seeing, speaking or moving following the seizure C. The patient was injured during the seizure D. The patient has a temperature over 102 F (39C) E. The patient vomited during the seizure and now is having trouble breathing  -- Amie Portland, MD [facility] Pager: [phone] If 7pm to 7am, please call on call as listed on [facility].

## 2018-02-01 NOTE — ED Triage Notes (Signed)
Pt brought in by [facility] from home for pseudo-seizures- hx of same. Pt given 5mg  IM of medazolam prior to this knowledge. Per EMS pt able to speak during seizures. Pt A+Ox4, hyperventilating on arrival.

## 2018-02-01 NOTE — Discharge Instructions (Signed)
It is important that you call your psychologist tomorrow to discuss the seizures that happened today in the ER. Do not drive or operate heavy machinery until you have been seizure-free for 6 months. Continue taking home medication as prescribed. There are seizure precautions listed below.  SEIZURE PRECAUTIONS Use caution when using heavy equipment or power tools. Avoid working on ladders or at heights. Take showers instead of baths. Ensure the water temperature is not too high on the home water heater. Do not go swimming alone. Do not lock yourself in a room alone (i.e. bathroom). When caring for infants or small children, sit down when holding, feeding, or changing them to minimize risk of injury to the child in the event you have a seizure. Maintain good sleep hygiene. Avoid alcohol.    If patient has another seizure, call 911 and bring them back to the ED if: A.  The seizure lasts longer than 5 minutes.      B.  The patient doesn't wake shortly after the seizure or has new problems such as difficulty seeing, speaking or moving following the seizure C.  The patient was injured during the seizure D.  The patient has a temperature over 102 F (39C) E.  The patient vomited during the seizure and now is having trouble breathing

## 2018-02-01 NOTE — ED Notes (Signed)
Ct called stating pt had another pseudo seizure- state pt complained of being "hot", started shaking his head from side to side, would stop to open his eyes and on viewing staff he would return to shaking his head.

## 2018-02-01 NOTE — ED Notes (Signed)
Patient verbalizes understanding of discharge instructions. Opportunity for questioning and answers were provided. Armband removed by staff, pt discharged from ED.  

## 2018-02-05 DIAGNOSIS — F332 Major depressive disorder, recurrent severe without psychotic features: Secondary | ICD-10-CM | POA: Diagnosis not present

## 2018-02-06 DIAGNOSIS — I10 Essential (primary) hypertension: Secondary | ICD-10-CM | POA: Diagnosis not present

## 2018-02-06 DIAGNOSIS — K4090 Unilateral inguinal hernia, without obstruction or gangrene, not specified as recurrent: Secondary | ICD-10-CM | POA: Diagnosis not present

## 2018-02-06 DIAGNOSIS — F17210 Nicotine dependence, cigarettes, uncomplicated: Secondary | ICD-10-CM | POA: Diagnosis not present

## 2018-02-06 DIAGNOSIS — R109 Unspecified abdominal pain: Secondary | ICD-10-CM | POA: Diagnosis not present

## 2018-02-06 DIAGNOSIS — K219 Gastro-esophageal reflux disease without esophagitis: Secondary | ICD-10-CM | POA: Diagnosis not present

## 2018-02-06 DIAGNOSIS — R1031 Right lower quadrant pain: Secondary | ICD-10-CM | POA: Diagnosis not present

## 2018-02-06 DIAGNOSIS — Z79899 Other long term (current) drug therapy: Secondary | ICD-10-CM | POA: Diagnosis not present

## 2018-02-06 DIAGNOSIS — Z8673 Personal history of transient ischemic attack (TIA), and cerebral infarction without residual deficits: Secondary | ICD-10-CM | POA: Diagnosis not present

## 2018-02-06 DIAGNOSIS — E119 Type 2 diabetes mellitus without complications: Secondary | ICD-10-CM | POA: Diagnosis not present

## 2018-02-07 DIAGNOSIS — R1031 Right lower quadrant pain: Secondary | ICD-10-CM | POA: Diagnosis not present

## 2018-02-07 DIAGNOSIS — R1030 Lower abdominal pain, unspecified: Secondary | ICD-10-CM | POA: Diagnosis not present

## 2018-02-07 DIAGNOSIS — F17210 Nicotine dependence, cigarettes, uncomplicated: Secondary | ICD-10-CM | POA: Diagnosis not present

## 2018-02-10 DIAGNOSIS — R935 Abnormal findings on diagnostic imaging of other abdominal regions, including retroperitoneum: Secondary | ICD-10-CM | POA: Diagnosis not present

## 2018-02-10 DIAGNOSIS — N433 Hydrocele, unspecified: Secondary | ICD-10-CM | POA: Diagnosis not present

## 2018-02-10 DIAGNOSIS — R1031 Right lower quadrant pain: Secondary | ICD-10-CM | POA: Diagnosis not present

## 2018-02-10 DIAGNOSIS — R52 Pain, unspecified: Secondary | ICD-10-CM | POA: Diagnosis not present

## 2018-02-10 DIAGNOSIS — D1809 Hemangioma of other sites: Secondary | ICD-10-CM | POA: Diagnosis not present

## 2018-02-10 DIAGNOSIS — K769 Liver disease, unspecified: Secondary | ICD-10-CM | POA: Diagnosis not present

## 2018-02-12 DIAGNOSIS — Z79899 Other long term (current) drug therapy: Secondary | ICD-10-CM | POA: Diagnosis not present

## 2018-02-12 DIAGNOSIS — K219 Gastro-esophageal reflux disease without esophagitis: Secondary | ICD-10-CM | POA: Diagnosis not present

## 2018-02-12 DIAGNOSIS — E785 Hyperlipidemia, unspecified: Secondary | ICD-10-CM | POA: Diagnosis not present

## 2018-02-12 DIAGNOSIS — S76219A Strain of adductor muscle, fascia and tendon of unspecified thigh, initial encounter: Secondary | ICD-10-CM | POA: Diagnosis not present

## 2018-02-12 DIAGNOSIS — G4089 Other seizures: Secondary | ICD-10-CM | POA: Diagnosis not present

## 2018-02-12 DIAGNOSIS — E039 Hypothyroidism, unspecified: Secondary | ICD-10-CM | POA: Diagnosis not present

## 2018-02-12 DIAGNOSIS — I10 Essential (primary) hypertension: Secondary | ICD-10-CM | POA: Diagnosis not present

## 2018-02-12 DIAGNOSIS — E119 Type 2 diabetes mellitus without complications: Secondary | ICD-10-CM | POA: Diagnosis not present

## 2018-02-12 DIAGNOSIS — N400 Enlarged prostate without lower urinary tract symptoms: Secondary | ICD-10-CM | POA: Diagnosis not present

## 2018-02-12 DIAGNOSIS — Z6833 Body mass index (BMI) 33.0-33.9, adult: Secondary | ICD-10-CM | POA: Diagnosis not present

## 2018-02-26 DIAGNOSIS — Z6834 Body mass index (BMI) 34.0-34.9, adult: Secondary | ICD-10-CM | POA: Diagnosis not present

## 2018-02-26 DIAGNOSIS — H6123 Impacted cerumen, bilateral: Secondary | ICD-10-CM | POA: Diagnosis not present

## 2018-02-26 DIAGNOSIS — G8929 Other chronic pain: Secondary | ICD-10-CM | POA: Diagnosis not present

## 2018-02-26 DIAGNOSIS — R110 Nausea: Secondary | ICD-10-CM | POA: Diagnosis not present

## 2018-03-11 DIAGNOSIS — R51 Headache: Secondary | ICD-10-CM | POA: Diagnosis not present

## 2018-03-11 DIAGNOSIS — R110 Nausea: Secondary | ICD-10-CM | POA: Diagnosis not present

## 2018-03-11 DIAGNOSIS — S0990XA Unspecified injury of head, initial encounter: Secondary | ICD-10-CM | POA: Diagnosis not present

## 2018-03-11 DIAGNOSIS — J439 Emphysema, unspecified: Secondary | ICD-10-CM | POA: Diagnosis not present

## 2018-03-11 DIAGNOSIS — H538 Other visual disturbances: Secondary | ICD-10-CM | POA: Diagnosis not present

## 2018-03-11 DIAGNOSIS — F445 Conversion disorder with seizures or convulsions: Secondary | ICD-10-CM | POA: Diagnosis not present

## 2018-03-18 DIAGNOSIS — G40909 Epilepsy, unspecified, not intractable, without status epilepticus: Secondary | ICD-10-CM | POA: Diagnosis not present

## 2018-03-18 DIAGNOSIS — E119 Type 2 diabetes mellitus without complications: Secondary | ICD-10-CM | POA: Diagnosis not present

## 2018-03-18 DIAGNOSIS — I10 Essential (primary) hypertension: Secondary | ICD-10-CM | POA: Diagnosis not present

## 2018-03-18 DIAGNOSIS — F319 Bipolar disorder, unspecified: Secondary | ICD-10-CM | POA: Diagnosis not present

## 2018-03-18 DIAGNOSIS — G8929 Other chronic pain: Secondary | ICD-10-CM | POA: Diagnosis not present

## 2018-03-18 DIAGNOSIS — I69354 Hemiplegia and hemiparesis following cerebral infarction affecting left non-dominant side: Secondary | ICD-10-CM | POA: Diagnosis not present

## 2018-03-22 DIAGNOSIS — J329 Chronic sinusitis, unspecified: Secondary | ICD-10-CM | POA: Diagnosis not present

## 2018-03-22 DIAGNOSIS — R062 Wheezing: Secondary | ICD-10-CM | POA: Diagnosis not present

## 2018-03-22 DIAGNOSIS — R569 Unspecified convulsions: Secondary | ICD-10-CM | POA: Diagnosis not present

## 2018-03-22 DIAGNOSIS — R000 Tachycardia, unspecified: Secondary | ICD-10-CM | POA: Diagnosis not present

## 2018-03-22 DIAGNOSIS — R4182 Altered mental status, unspecified: Secondary | ICD-10-CM | POA: Diagnosis not present

## 2018-03-22 DIAGNOSIS — G259 Extrapyramidal and movement disorder, unspecified: Secondary | ICD-10-CM | POA: Diagnosis not present

## 2018-03-22 DIAGNOSIS — E876 Hypokalemia: Secondary | ICD-10-CM | POA: Diagnosis not present

## 2018-03-22 DIAGNOSIS — R7989 Other specified abnormal findings of blood chemistry: Secondary | ICD-10-CM | POA: Diagnosis not present

## 2018-03-22 DIAGNOSIS — R404 Transient alteration of awareness: Secondary | ICD-10-CM | POA: Diagnosis not present

## 2018-03-22 DIAGNOSIS — R0689 Other abnormalities of breathing: Secondary | ICD-10-CM | POA: Diagnosis not present

## 2018-03-27 DIAGNOSIS — R0781 Pleurodynia: Secondary | ICD-10-CM | POA: Diagnosis not present

## 2018-03-27 DIAGNOSIS — R062 Wheezing: Secondary | ICD-10-CM | POA: Diagnosis not present

## 2018-03-27 DIAGNOSIS — G8929 Other chronic pain: Secondary | ICD-10-CM | POA: Diagnosis not present

## 2018-03-27 DIAGNOSIS — G4089 Other seizures: Secondary | ICD-10-CM | POA: Diagnosis not present

## 2018-03-27 DIAGNOSIS — Z6834 Body mass index (BMI) 34.0-34.9, adult: Secondary | ICD-10-CM | POA: Diagnosis not present

## 2018-04-01 DIAGNOSIS — F319 Bipolar disorder, unspecified: Secondary | ICD-10-CM | POA: Diagnosis not present

## 2018-04-01 DIAGNOSIS — E119 Type 2 diabetes mellitus without complications: Secondary | ICD-10-CM | POA: Diagnosis not present

## 2018-04-01 DIAGNOSIS — G40909 Epilepsy, unspecified, not intractable, without status epilepticus: Secondary | ICD-10-CM | POA: Diagnosis not present

## 2018-04-01 DIAGNOSIS — I69354 Hemiplegia and hemiparesis following cerebral infarction affecting left non-dominant side: Secondary | ICD-10-CM | POA: Diagnosis not present

## 2018-04-01 DIAGNOSIS — G8929 Other chronic pain: Secondary | ICD-10-CM | POA: Diagnosis not present

## 2018-04-01 DIAGNOSIS — I10 Essential (primary) hypertension: Secondary | ICD-10-CM | POA: Diagnosis not present

## 2018-04-03 DIAGNOSIS — G8929 Other chronic pain: Secondary | ICD-10-CM | POA: Diagnosis not present

## 2018-04-03 DIAGNOSIS — I10 Essential (primary) hypertension: Secondary | ICD-10-CM | POA: Diagnosis not present

## 2018-04-03 DIAGNOSIS — F319 Bipolar disorder, unspecified: Secondary | ICD-10-CM | POA: Diagnosis not present

## 2018-04-03 DIAGNOSIS — G40909 Epilepsy, unspecified, not intractable, without status epilepticus: Secondary | ICD-10-CM | POA: Diagnosis not present

## 2018-04-03 DIAGNOSIS — E119 Type 2 diabetes mellitus without complications: Secondary | ICD-10-CM | POA: Diagnosis not present

## 2018-04-03 DIAGNOSIS — I69354 Hemiplegia and hemiparesis following cerebral infarction affecting left non-dominant side: Secondary | ICD-10-CM | POA: Diagnosis not present

## 2018-04-06 DIAGNOSIS — G8929 Other chronic pain: Secondary | ICD-10-CM | POA: Diagnosis not present

## 2018-04-06 DIAGNOSIS — E119 Type 2 diabetes mellitus without complications: Secondary | ICD-10-CM | POA: Diagnosis not present

## 2018-04-06 DIAGNOSIS — I10 Essential (primary) hypertension: Secondary | ICD-10-CM | POA: Diagnosis not present

## 2018-04-06 DIAGNOSIS — F319 Bipolar disorder, unspecified: Secondary | ICD-10-CM | POA: Diagnosis not present

## 2018-04-06 DIAGNOSIS — G40909 Epilepsy, unspecified, not intractable, without status epilepticus: Secondary | ICD-10-CM | POA: Diagnosis not present

## 2018-04-06 DIAGNOSIS — I69354 Hemiplegia and hemiparesis following cerebral infarction affecting left non-dominant side: Secondary | ICD-10-CM | POA: Diagnosis not present

## 2018-04-11 DIAGNOSIS — G40909 Epilepsy, unspecified, not intractable, without status epilepticus: Secondary | ICD-10-CM | POA: Diagnosis not present

## 2018-04-11 DIAGNOSIS — I10 Essential (primary) hypertension: Secondary | ICD-10-CM | POA: Diagnosis not present

## 2018-04-11 DIAGNOSIS — G8929 Other chronic pain: Secondary | ICD-10-CM | POA: Diagnosis not present

## 2018-04-11 DIAGNOSIS — E119 Type 2 diabetes mellitus without complications: Secondary | ICD-10-CM | POA: Diagnosis not present

## 2018-04-11 DIAGNOSIS — I69354 Hemiplegia and hemiparesis following cerebral infarction affecting left non-dominant side: Secondary | ICD-10-CM | POA: Diagnosis not present

## 2018-04-11 DIAGNOSIS — F319 Bipolar disorder, unspecified: Secondary | ICD-10-CM | POA: Diagnosis not present

## 2018-04-13 DIAGNOSIS — F319 Bipolar disorder, unspecified: Secondary | ICD-10-CM | POA: Diagnosis not present

## 2018-04-13 DIAGNOSIS — G8929 Other chronic pain: Secondary | ICD-10-CM | POA: Diagnosis not present

## 2018-04-13 DIAGNOSIS — E119 Type 2 diabetes mellitus without complications: Secondary | ICD-10-CM | POA: Diagnosis not present

## 2018-04-13 DIAGNOSIS — I69354 Hemiplegia and hemiparesis following cerebral infarction affecting left non-dominant side: Secondary | ICD-10-CM | POA: Diagnosis not present

## 2018-04-13 DIAGNOSIS — I10 Essential (primary) hypertension: Secondary | ICD-10-CM | POA: Diagnosis not present

## 2018-04-13 DIAGNOSIS — G40909 Epilepsy, unspecified, not intractable, without status epilepticus: Secondary | ICD-10-CM | POA: Diagnosis not present

## 2018-04-18 DIAGNOSIS — I10 Essential (primary) hypertension: Secondary | ICD-10-CM | POA: Diagnosis not present

## 2018-04-18 DIAGNOSIS — G8929 Other chronic pain: Secondary | ICD-10-CM | POA: Diagnosis not present

## 2018-04-18 DIAGNOSIS — F319 Bipolar disorder, unspecified: Secondary | ICD-10-CM | POA: Diagnosis not present

## 2018-04-18 DIAGNOSIS — G40909 Epilepsy, unspecified, not intractable, without status epilepticus: Secondary | ICD-10-CM | POA: Diagnosis not present

## 2018-04-18 DIAGNOSIS — E119 Type 2 diabetes mellitus without complications: Secondary | ICD-10-CM | POA: Diagnosis not present

## 2018-04-18 DIAGNOSIS — I69354 Hemiplegia and hemiparesis following cerebral infarction affecting left non-dominant side: Secondary | ICD-10-CM | POA: Diagnosis not present

## 2018-04-21 DIAGNOSIS — F319 Bipolar disorder, unspecified: Secondary | ICD-10-CM | POA: Diagnosis not present

## 2018-04-21 DIAGNOSIS — G8929 Other chronic pain: Secondary | ICD-10-CM | POA: Diagnosis not present

## 2018-04-21 DIAGNOSIS — I10 Essential (primary) hypertension: Secondary | ICD-10-CM | POA: Diagnosis not present

## 2018-04-21 DIAGNOSIS — G40909 Epilepsy, unspecified, not intractable, without status epilepticus: Secondary | ICD-10-CM | POA: Diagnosis not present

## 2018-04-21 DIAGNOSIS — I69354 Hemiplegia and hemiparesis following cerebral infarction affecting left non-dominant side: Secondary | ICD-10-CM | POA: Diagnosis not present

## 2018-04-21 DIAGNOSIS — E119 Type 2 diabetes mellitus without complications: Secondary | ICD-10-CM | POA: Diagnosis not present

## 2018-04-25 DIAGNOSIS — F319 Bipolar disorder, unspecified: Secondary | ICD-10-CM | POA: Diagnosis not present

## 2018-04-25 DIAGNOSIS — I10 Essential (primary) hypertension: Secondary | ICD-10-CM | POA: Diagnosis not present

## 2018-04-25 DIAGNOSIS — G40909 Epilepsy, unspecified, not intractable, without status epilepticus: Secondary | ICD-10-CM | POA: Diagnosis not present

## 2018-04-25 DIAGNOSIS — G8929 Other chronic pain: Secondary | ICD-10-CM | POA: Diagnosis not present

## 2018-04-25 DIAGNOSIS — E119 Type 2 diabetes mellitus without complications: Secondary | ICD-10-CM | POA: Diagnosis not present

## 2018-04-25 DIAGNOSIS — I69354 Hemiplegia and hemiparesis following cerebral infarction affecting left non-dominant side: Secondary | ICD-10-CM | POA: Diagnosis not present

## 2018-04-28 DIAGNOSIS — G40909 Epilepsy, unspecified, not intractable, without status epilepticus: Secondary | ICD-10-CM | POA: Diagnosis not present

## 2018-04-28 DIAGNOSIS — E119 Type 2 diabetes mellitus without complications: Secondary | ICD-10-CM | POA: Diagnosis not present

## 2018-04-28 DIAGNOSIS — F319 Bipolar disorder, unspecified: Secondary | ICD-10-CM | POA: Diagnosis not present

## 2018-04-28 DIAGNOSIS — I10 Essential (primary) hypertension: Secondary | ICD-10-CM | POA: Diagnosis not present

## 2018-04-28 DIAGNOSIS — G8929 Other chronic pain: Secondary | ICD-10-CM | POA: Diagnosis not present

## 2018-04-28 DIAGNOSIS — I69354 Hemiplegia and hemiparesis following cerebral infarction affecting left non-dominant side: Secondary | ICD-10-CM | POA: Diagnosis not present

## 2018-04-29 DIAGNOSIS — K219 Gastro-esophageal reflux disease without esophagitis: Secondary | ICD-10-CM | POA: Diagnosis not present

## 2018-04-29 DIAGNOSIS — Z6835 Body mass index (BMI) 35.0-35.9, adult: Secondary | ICD-10-CM | POA: Diagnosis not present

## 2018-04-29 DIAGNOSIS — M79602 Pain in left arm: Secondary | ICD-10-CM | POA: Diagnosis not present

## 2018-04-29 DIAGNOSIS — H6123 Impacted cerumen, bilateral: Secondary | ICD-10-CM | POA: Diagnosis not present

## 2018-04-29 DIAGNOSIS — G2581 Restless legs syndrome: Secondary | ICD-10-CM | POA: Diagnosis not present

## 2018-04-29 DIAGNOSIS — N400 Enlarged prostate without lower urinary tract symptoms: Secondary | ICD-10-CM | POA: Diagnosis not present

## 2018-04-29 DIAGNOSIS — G8929 Other chronic pain: Secondary | ICD-10-CM | POA: Diagnosis not present

## 2018-04-29 DIAGNOSIS — E785 Hyperlipidemia, unspecified: Secondary | ICD-10-CM | POA: Diagnosis not present

## 2018-04-29 DIAGNOSIS — E119 Type 2 diabetes mellitus without complications: Secondary | ICD-10-CM | POA: Diagnosis not present

## 2018-04-29 DIAGNOSIS — G4089 Other seizures: Secondary | ICD-10-CM | POA: Diagnosis not present

## 2018-04-29 DIAGNOSIS — I10 Essential (primary) hypertension: Secondary | ICD-10-CM | POA: Diagnosis not present

## 2018-04-29 DIAGNOSIS — Z0001 Encounter for general adult medical examination with abnormal findings: Secondary | ICD-10-CM | POA: Diagnosis not present

## 2018-05-02 DIAGNOSIS — I69354 Hemiplegia and hemiparesis following cerebral infarction affecting left non-dominant side: Secondary | ICD-10-CM | POA: Diagnosis not present

## 2018-05-02 DIAGNOSIS — G8929 Other chronic pain: Secondary | ICD-10-CM | POA: Diagnosis not present

## 2018-05-02 DIAGNOSIS — E119 Type 2 diabetes mellitus without complications: Secondary | ICD-10-CM | POA: Diagnosis not present

## 2018-05-02 DIAGNOSIS — G40909 Epilepsy, unspecified, not intractable, without status epilepticus: Secondary | ICD-10-CM | POA: Diagnosis not present

## 2018-05-02 DIAGNOSIS — I10 Essential (primary) hypertension: Secondary | ICD-10-CM | POA: Diagnosis not present

## 2018-05-02 DIAGNOSIS — F319 Bipolar disorder, unspecified: Secondary | ICD-10-CM | POA: Diagnosis not present

## 2018-05-04 DIAGNOSIS — I69354 Hemiplegia and hemiparesis following cerebral infarction affecting left non-dominant side: Secondary | ICD-10-CM | POA: Diagnosis not present

## 2018-05-04 DIAGNOSIS — I10 Essential (primary) hypertension: Secondary | ICD-10-CM | POA: Diagnosis not present

## 2018-05-04 DIAGNOSIS — F319 Bipolar disorder, unspecified: Secondary | ICD-10-CM | POA: Diagnosis not present

## 2018-05-04 DIAGNOSIS — E119 Type 2 diabetes mellitus without complications: Secondary | ICD-10-CM | POA: Diagnosis not present

## 2018-05-04 DIAGNOSIS — G8929 Other chronic pain: Secondary | ICD-10-CM | POA: Diagnosis not present

## 2018-05-04 DIAGNOSIS — G40909 Epilepsy, unspecified, not intractable, without status epilepticus: Secondary | ICD-10-CM | POA: Diagnosis not present

## 2018-05-22 DIAGNOSIS — R600 Localized edema: Secondary | ICD-10-CM | POA: Diagnosis not present

## 2018-05-22 DIAGNOSIS — J329 Chronic sinusitis, unspecified: Secondary | ICD-10-CM | POA: Diagnosis not present

## 2018-05-22 DIAGNOSIS — S0990XA Unspecified injury of head, initial encounter: Secondary | ICD-10-CM | POA: Diagnosis not present

## 2018-06-01 DIAGNOSIS — I10 Essential (primary) hypertension: Secondary | ICD-10-CM | POA: Diagnosis not present

## 2018-06-01 DIAGNOSIS — Z6836 Body mass index (BMI) 36.0-36.9, adult: Secondary | ICD-10-CM | POA: Diagnosis not present

## 2018-06-01 DIAGNOSIS — G4089 Other seizures: Secondary | ICD-10-CM | POA: Diagnosis not present

## 2018-06-01 DIAGNOSIS — R600 Localized edema: Secondary | ICD-10-CM | POA: Diagnosis not present

## 2018-06-01 DIAGNOSIS — L0390 Cellulitis, unspecified: Secondary | ICD-10-CM | POA: Diagnosis not present

## 2018-06-08 DIAGNOSIS — E119 Type 2 diabetes mellitus without complications: Secondary | ICD-10-CM | POA: Diagnosis not present

## 2018-06-08 DIAGNOSIS — M79606 Pain in leg, unspecified: Secondary | ICD-10-CM | POA: Diagnosis not present

## 2018-06-08 DIAGNOSIS — Z791 Long term (current) use of non-steroidal anti-inflammatories (NSAID): Secondary | ICD-10-CM | POA: Diagnosis not present

## 2018-06-08 DIAGNOSIS — I10 Essential (primary) hypertension: Secondary | ICD-10-CM | POA: Diagnosis not present

## 2018-06-08 DIAGNOSIS — J449 Chronic obstructive pulmonary disease, unspecified: Secondary | ICD-10-CM | POA: Diagnosis not present

## 2018-06-08 DIAGNOSIS — Z79899 Other long term (current) drug therapy: Secondary | ICD-10-CM | POA: Diagnosis not present

## 2018-06-08 DIAGNOSIS — F17210 Nicotine dependence, cigarettes, uncomplicated: Secondary | ICD-10-CM | POA: Diagnosis not present

## 2018-06-08 DIAGNOSIS — M7989 Other specified soft tissue disorders: Secondary | ICD-10-CM | POA: Diagnosis not present

## 2018-06-08 DIAGNOSIS — L03116 Cellulitis of left lower limb: Secondary | ICD-10-CM | POA: Diagnosis not present

## 2018-06-08 DIAGNOSIS — L03115 Cellulitis of right lower limb: Secondary | ICD-10-CM | POA: Diagnosis not present

## 2018-06-08 DIAGNOSIS — M79673 Pain in unspecified foot: Secondary | ICD-10-CM | POA: Diagnosis not present

## 2018-06-15 DIAGNOSIS — F209 Schizophrenia, unspecified: Secondary | ICD-10-CM | POA: Diagnosis not present

## 2018-06-15 DIAGNOSIS — R609 Edema, unspecified: Secondary | ICD-10-CM | POA: Diagnosis not present

## 2018-06-15 DIAGNOSIS — E669 Obesity, unspecified: Secondary | ICD-10-CM | POA: Diagnosis not present

## 2018-06-15 DIAGNOSIS — D649 Anemia, unspecified: Secondary | ICD-10-CM | POA: Diagnosis not present

## 2018-06-15 DIAGNOSIS — F319 Bipolar disorder, unspecified: Secondary | ICD-10-CM | POA: Diagnosis not present

## 2018-06-15 DIAGNOSIS — I10 Essential (primary) hypertension: Secondary | ICD-10-CM | POA: Diagnosis not present

## 2018-06-15 DIAGNOSIS — Z6836 Body mass index (BMI) 36.0-36.9, adult: Secondary | ICD-10-CM | POA: Diagnosis not present

## 2018-06-15 DIAGNOSIS — K219 Gastro-esophageal reflux disease without esophagitis: Secondary | ICD-10-CM | POA: Diagnosis not present

## 2018-06-15 DIAGNOSIS — L0390 Cellulitis, unspecified: Secondary | ICD-10-CM | POA: Diagnosis not present

## 2018-06-29 DIAGNOSIS — G4089 Other seizures: Secondary | ICD-10-CM | POA: Diagnosis not present

## 2018-06-29 DIAGNOSIS — I10 Essential (primary) hypertension: Secondary | ICD-10-CM | POA: Diagnosis not present

## 2018-06-29 DIAGNOSIS — R600 Localized edema: Secondary | ICD-10-CM | POA: Diagnosis not present

## 2018-06-29 DIAGNOSIS — F209 Schizophrenia, unspecified: Secondary | ICD-10-CM | POA: Diagnosis not present

## 2018-06-29 DIAGNOSIS — Z6835 Body mass index (BMI) 35.0-35.9, adult: Secondary | ICD-10-CM | POA: Diagnosis not present

## 2018-07-17 DIAGNOSIS — Z720 Tobacco use: Secondary | ICD-10-CM | POA: Diagnosis not present

## 2018-07-17 DIAGNOSIS — F17210 Nicotine dependence, cigarettes, uncomplicated: Secondary | ICD-10-CM | POA: Diagnosis not present

## 2018-07-17 DIAGNOSIS — E860 Dehydration: Secondary | ICD-10-CM | POA: Diagnosis not present

## 2018-07-17 DIAGNOSIS — R52 Pain, unspecified: Secondary | ICD-10-CM | POA: Diagnosis not present

## 2018-07-17 DIAGNOSIS — F319 Bipolar disorder, unspecified: Secondary | ICD-10-CM | POA: Diagnosis present

## 2018-07-17 DIAGNOSIS — E872 Acidosis: Secondary | ICD-10-CM | POA: Diagnosis present

## 2018-07-17 DIAGNOSIS — R110 Nausea: Secondary | ICD-10-CM | POA: Diagnosis not present

## 2018-07-17 DIAGNOSIS — J111 Influenza due to unidentified influenza virus with other respiratory manifestations: Secondary | ICD-10-CM | POA: Diagnosis not present

## 2018-07-17 DIAGNOSIS — G40909 Epilepsy, unspecified, not intractable, without status epilepticus: Secondary | ICD-10-CM | POA: Diagnosis present

## 2018-07-17 DIAGNOSIS — J80 Acute respiratory distress syndrome: Secondary | ICD-10-CM | POA: Diagnosis not present

## 2018-07-17 DIAGNOSIS — E876 Hypokalemia: Secondary | ICD-10-CM | POA: Diagnosis not present

## 2018-07-17 DIAGNOSIS — R531 Weakness: Secondary | ICD-10-CM | POA: Diagnosis not present

## 2018-07-17 DIAGNOSIS — J1089 Influenza due to other identified influenza virus with other manifestations: Secondary | ICD-10-CM | POA: Diagnosis present

## 2018-07-17 DIAGNOSIS — N179 Acute kidney failure, unspecified: Secondary | ICD-10-CM | POA: Diagnosis not present

## 2018-07-17 DIAGNOSIS — J449 Chronic obstructive pulmonary disease, unspecified: Secondary | ICD-10-CM | POA: Diagnosis present

## 2018-07-17 DIAGNOSIS — E871 Hypo-osmolality and hyponatremia: Secondary | ICD-10-CM | POA: Diagnosis not present

## 2018-07-17 DIAGNOSIS — R112 Nausea with vomiting, unspecified: Secondary | ICD-10-CM | POA: Diagnosis not present

## 2018-07-17 DIAGNOSIS — J101 Influenza due to other identified influenza virus with other respiratory manifestations: Secondary | ICD-10-CM | POA: Diagnosis not present

## 2018-07-31 DIAGNOSIS — K219 Gastro-esophageal reflux disease without esophagitis: Secondary | ICD-10-CM | POA: Diagnosis not present

## 2018-07-31 DIAGNOSIS — E785 Hyperlipidemia, unspecified: Secondary | ICD-10-CM | POA: Diagnosis not present

## 2018-07-31 DIAGNOSIS — I6789 Other cerebrovascular disease: Secondary | ICD-10-CM | POA: Diagnosis not present

## 2018-07-31 DIAGNOSIS — R0602 Shortness of breath: Secondary | ICD-10-CM | POA: Diagnosis not present

## 2018-07-31 DIAGNOSIS — I10 Essential (primary) hypertension: Secondary | ICD-10-CM | POA: Diagnosis not present

## 2018-07-31 DIAGNOSIS — J111 Influenza due to unidentified influenza virus with other respiratory manifestations: Secondary | ICD-10-CM | POA: Diagnosis not present

## 2018-07-31 DIAGNOSIS — R600 Localized edema: Secondary | ICD-10-CM | POA: Diagnosis not present

## 2018-08-13 DIAGNOSIS — I10 Essential (primary) hypertension: Secondary | ICD-10-CM | POA: Diagnosis not present

## 2018-08-13 DIAGNOSIS — K219 Gastro-esophageal reflux disease without esophagitis: Secondary | ICD-10-CM | POA: Diagnosis not present

## 2018-08-13 DIAGNOSIS — G4089 Other seizures: Secondary | ICD-10-CM | POA: Diagnosis not present

## 2018-08-13 DIAGNOSIS — R062 Wheezing: Secondary | ICD-10-CM | POA: Diagnosis not present

## 2018-08-13 DIAGNOSIS — R600 Localized edema: Secondary | ICD-10-CM | POA: Diagnosis not present

## 2018-08-13 DIAGNOSIS — Z6836 Body mass index (BMI) 36.0-36.9, adult: Secondary | ICD-10-CM | POA: Diagnosis not present

## 2018-08-18 DIAGNOSIS — G4089 Other seizures: Secondary | ICD-10-CM | POA: Diagnosis not present

## 2018-08-18 DIAGNOSIS — R062 Wheezing: Secondary | ICD-10-CM | POA: Diagnosis not present

## 2018-08-18 DIAGNOSIS — K219 Gastro-esophageal reflux disease without esophagitis: Secondary | ICD-10-CM | POA: Diagnosis not present

## 2018-08-18 DIAGNOSIS — R600 Localized edema: Secondary | ICD-10-CM | POA: Diagnosis not present

## 2018-09-23 DIAGNOSIS — R600 Localized edema: Secondary | ICD-10-CM | POA: Diagnosis not present

## 2018-09-23 DIAGNOSIS — F319 Bipolar disorder, unspecified: Secondary | ICD-10-CM | POA: Diagnosis not present

## 2018-09-23 DIAGNOSIS — E785 Hyperlipidemia, unspecified: Secondary | ICD-10-CM | POA: Diagnosis not present

## 2018-09-23 DIAGNOSIS — G4089 Other seizures: Secondary | ICD-10-CM | POA: Diagnosis not present

## 2018-09-23 DIAGNOSIS — F209 Schizophrenia, unspecified: Secondary | ICD-10-CM | POA: Diagnosis not present

## 2018-09-30 DIAGNOSIS — N400 Enlarged prostate without lower urinary tract symptoms: Secondary | ICD-10-CM | POA: Diagnosis not present

## 2018-09-30 DIAGNOSIS — G4089 Other seizures: Secondary | ICD-10-CM | POA: Diagnosis not present

## 2018-09-30 DIAGNOSIS — R600 Localized edema: Secondary | ICD-10-CM | POA: Diagnosis not present

## 2018-09-30 DIAGNOSIS — E785 Hyperlipidemia, unspecified: Secondary | ICD-10-CM | POA: Diagnosis not present

## 2018-09-30 DIAGNOSIS — M549 Dorsalgia, unspecified: Secondary | ICD-10-CM | POA: Diagnosis not present

## 2018-09-30 DIAGNOSIS — K219 Gastro-esophageal reflux disease without esophagitis: Secondary | ICD-10-CM | POA: Diagnosis not present

## 2018-10-15 DIAGNOSIS — I10 Essential (primary) hypertension: Secondary | ICD-10-CM | POA: Diagnosis not present

## 2018-10-15 DIAGNOSIS — M549 Dorsalgia, unspecified: Secondary | ICD-10-CM | POA: Diagnosis not present

## 2018-10-15 DIAGNOSIS — N400 Enlarged prostate without lower urinary tract symptoms: Secondary | ICD-10-CM | POA: Diagnosis not present

## 2018-10-15 DIAGNOSIS — D649 Anemia, unspecified: Secondary | ICD-10-CM | POA: Diagnosis not present

## 2018-10-15 DIAGNOSIS — E669 Obesity, unspecified: Secondary | ICD-10-CM | POA: Diagnosis not present

## 2018-10-15 DIAGNOSIS — R609 Edema, unspecified: Secondary | ICD-10-CM | POA: Diagnosis not present

## 2018-10-15 DIAGNOSIS — R569 Unspecified convulsions: Secondary | ICD-10-CM | POA: Diagnosis not present

## 2018-10-15 DIAGNOSIS — Z6836 Body mass index (BMI) 36.0-36.9, adult: Secondary | ICD-10-CM | POA: Diagnosis not present

## 2018-10-15 DIAGNOSIS — J302 Other seasonal allergic rhinitis: Secondary | ICD-10-CM | POA: Diagnosis not present

## 2018-10-16 DIAGNOSIS — M961 Postlaminectomy syndrome, not elsewhere classified: Secondary | ICD-10-CM | POA: Diagnosis not present

## 2018-10-22 DIAGNOSIS — R609 Edema, unspecified: Secondary | ICD-10-CM | POA: Diagnosis not present

## 2018-10-22 DIAGNOSIS — M25579 Pain in unspecified ankle and joints of unspecified foot: Secondary | ICD-10-CM | POA: Diagnosis not present

## 2018-10-22 DIAGNOSIS — M549 Dorsalgia, unspecified: Secondary | ICD-10-CM | POA: Diagnosis not present

## 2018-10-22 DIAGNOSIS — G4089 Other seizures: Secondary | ICD-10-CM | POA: Diagnosis not present

## 2018-10-22 DIAGNOSIS — E785 Hyperlipidemia, unspecified: Secondary | ICD-10-CM | POA: Diagnosis not present

## 2018-11-04 DIAGNOSIS — E559 Vitamin D deficiency, unspecified: Secondary | ICD-10-CM | POA: Diagnosis not present

## 2018-11-04 DIAGNOSIS — G4089 Other seizures: Secondary | ICD-10-CM | POA: Diagnosis not present

## 2018-11-04 DIAGNOSIS — E538 Deficiency of other specified B group vitamins: Secondary | ICD-10-CM | POA: Diagnosis not present

## 2018-11-04 DIAGNOSIS — E785 Hyperlipidemia, unspecified: Secondary | ICD-10-CM | POA: Diagnosis not present

## 2018-11-04 DIAGNOSIS — E039 Hypothyroidism, unspecified: Secondary | ICD-10-CM | POA: Diagnosis not present

## 2018-11-04 DIAGNOSIS — G40909 Epilepsy, unspecified, not intractable, without status epilepticus: Secondary | ICD-10-CM | POA: Diagnosis not present

## 2018-11-04 DIAGNOSIS — I10 Essential (primary) hypertension: Secondary | ICD-10-CM | POA: Diagnosis not present

## 2018-11-04 DIAGNOSIS — E119 Type 2 diabetes mellitus without complications: Secondary | ICD-10-CM | POA: Diagnosis not present

## 2018-12-29 DIAGNOSIS — R531 Weakness: Secondary | ICD-10-CM | POA: Diagnosis not present

## 2018-12-29 DIAGNOSIS — Z8673 Personal history of transient ischemic attack (TIA), and cerebral infarction without residual deficits: Secondary | ICD-10-CM | POA: Diagnosis not present

## 2018-12-29 DIAGNOSIS — G4089 Other seizures: Secondary | ICD-10-CM | POA: Diagnosis not present

## 2018-12-29 DIAGNOSIS — E669 Obesity, unspecified: Secondary | ICD-10-CM | POA: Diagnosis present

## 2018-12-29 DIAGNOSIS — E785 Hyperlipidemia, unspecified: Secondary | ICD-10-CM | POA: Diagnosis not present

## 2018-12-29 DIAGNOSIS — Z6837 Body mass index (BMI) 37.0-37.9, adult: Secondary | ICD-10-CM | POA: Diagnosis not present

## 2018-12-29 DIAGNOSIS — E876 Hypokalemia: Secondary | ICD-10-CM | POA: Diagnosis not present

## 2018-12-29 DIAGNOSIS — G40909 Epilepsy, unspecified, not intractable, without status epilepticus: Secondary | ICD-10-CM | POA: Diagnosis present

## 2018-12-29 DIAGNOSIS — Z9689 Presence of other specified functional implants: Secondary | ICD-10-CM | POA: Diagnosis not present

## 2018-12-29 DIAGNOSIS — G8194 Hemiplegia, unspecified affecting left nondominant side: Secondary | ICD-10-CM | POA: Diagnosis not present

## 2018-12-29 DIAGNOSIS — I6990 Unspecified sequelae of unspecified cerebrovascular disease: Secondary | ICD-10-CM | POA: Diagnosis not present

## 2018-12-29 DIAGNOSIS — R52 Pain, unspecified: Secondary | ICD-10-CM | POA: Diagnosis not present

## 2018-12-29 DIAGNOSIS — I639 Cerebral infarction, unspecified: Secondary | ICD-10-CM | POA: Diagnosis not present

## 2018-12-29 DIAGNOSIS — R29810 Facial weakness: Secondary | ICD-10-CM | POA: Diagnosis not present

## 2018-12-29 DIAGNOSIS — G8190 Hemiplegia, unspecified affecting unspecified side: Secondary | ICD-10-CM | POA: Diagnosis not present

## 2018-12-29 DIAGNOSIS — J449 Chronic obstructive pulmonary disease, unspecified: Secondary | ICD-10-CM | POA: Diagnosis not present

## 2018-12-29 DIAGNOSIS — R29818 Other symptoms and signs involving the nervous system: Secondary | ICD-10-CM | POA: Diagnosis not present

## 2018-12-29 DIAGNOSIS — F17210 Nicotine dependence, cigarettes, uncomplicated: Secondary | ICD-10-CM | POA: Diagnosis not present

## 2018-12-29 DIAGNOSIS — R569 Unspecified convulsions: Secondary | ICD-10-CM | POA: Diagnosis not present

## 2018-12-29 DIAGNOSIS — F319 Bipolar disorder, unspecified: Secondary | ICD-10-CM | POA: Diagnosis not present

## 2018-12-29 DIAGNOSIS — R2990 Unspecified symptoms and signs involving the nervous system: Secondary | ICD-10-CM | POA: Diagnosis not present

## 2019-01-01 DIAGNOSIS — M6281 Muscle weakness (generalized): Secondary | ICD-10-CM | POA: Diagnosis not present

## 2019-01-01 DIAGNOSIS — G40909 Epilepsy, unspecified, not intractable, without status epilepticus: Secondary | ICD-10-CM | POA: Diagnosis not present

## 2019-01-01 DIAGNOSIS — D649 Anemia, unspecified: Secondary | ICD-10-CM | POA: Diagnosis not present

## 2019-01-01 DIAGNOSIS — E119 Type 2 diabetes mellitus without complications: Secondary | ICD-10-CM | POA: Diagnosis not present

## 2019-01-01 DIAGNOSIS — Z9181 History of falling: Secondary | ICD-10-CM | POA: Diagnosis not present

## 2019-01-01 DIAGNOSIS — Z7951 Long term (current) use of inhaled steroids: Secondary | ICD-10-CM | POA: Diagnosis not present

## 2019-01-01 DIAGNOSIS — I10 Essential (primary) hypertension: Secondary | ICD-10-CM | POA: Diagnosis not present

## 2019-01-01 DIAGNOSIS — F17210 Nicotine dependence, cigarettes, uncomplicated: Secondary | ICD-10-CM | POA: Diagnosis not present

## 2019-01-01 DIAGNOSIS — R32 Unspecified urinary incontinence: Secondary | ICD-10-CM | POA: Diagnosis not present

## 2019-01-01 DIAGNOSIS — K219 Gastro-esophageal reflux disease without esophagitis: Secondary | ICD-10-CM | POA: Diagnosis not present

## 2019-01-01 DIAGNOSIS — F250 Schizoaffective disorder, bipolar type: Secondary | ICD-10-CM | POA: Diagnosis not present

## 2019-01-01 DIAGNOSIS — I69398 Other sequelae of cerebral infarction: Secondary | ICD-10-CM | POA: Diagnosis not present

## 2019-01-01 DIAGNOSIS — M109 Gout, unspecified: Secondary | ICD-10-CM | POA: Diagnosis not present

## 2019-01-01 DIAGNOSIS — G8929 Other chronic pain: Secondary | ICD-10-CM | POA: Diagnosis not present

## 2019-01-01 DIAGNOSIS — K589 Irritable bowel syndrome without diarrhea: Secondary | ICD-10-CM | POA: Diagnosis not present

## 2019-01-01 DIAGNOSIS — E785 Hyperlipidemia, unspecified: Secondary | ICD-10-CM | POA: Diagnosis not present

## 2019-01-01 DIAGNOSIS — Z87820 Personal history of traumatic brain injury: Secondary | ICD-10-CM | POA: Diagnosis not present

## 2019-01-01 DIAGNOSIS — J449 Chronic obstructive pulmonary disease, unspecified: Secondary | ICD-10-CM | POA: Diagnosis not present

## 2019-01-01 DIAGNOSIS — F319 Bipolar disorder, unspecified: Secondary | ICD-10-CM | POA: Diagnosis not present

## 2019-01-01 DIAGNOSIS — Z9682 Presence of neurostimulator: Secondary | ICD-10-CM | POA: Diagnosis not present

## 2019-01-01 DIAGNOSIS — N400 Enlarged prostate without lower urinary tract symptoms: Secondary | ICD-10-CM | POA: Diagnosis not present

## 2019-01-01 DIAGNOSIS — E669 Obesity, unspecified: Secondary | ICD-10-CM | POA: Diagnosis not present

## 2019-01-01 DIAGNOSIS — G2581 Restless legs syndrome: Secondary | ICD-10-CM | POA: Diagnosis not present

## 2019-01-04 DIAGNOSIS — E119 Type 2 diabetes mellitus without complications: Secondary | ICD-10-CM | POA: Diagnosis not present

## 2019-01-04 DIAGNOSIS — I10 Essential (primary) hypertension: Secondary | ICD-10-CM | POA: Diagnosis not present

## 2019-01-04 DIAGNOSIS — I69398 Other sequelae of cerebral infarction: Secondary | ICD-10-CM | POA: Diagnosis not present

## 2019-01-04 DIAGNOSIS — F250 Schizoaffective disorder, bipolar type: Secondary | ICD-10-CM | POA: Diagnosis not present

## 2019-01-04 DIAGNOSIS — J449 Chronic obstructive pulmonary disease, unspecified: Secondary | ICD-10-CM | POA: Diagnosis not present

## 2019-01-04 DIAGNOSIS — M6281 Muscle weakness (generalized): Secondary | ICD-10-CM | POA: Diagnosis not present

## 2019-01-06 DIAGNOSIS — G4089 Other seizures: Secondary | ICD-10-CM | POA: Diagnosis not present

## 2019-01-06 DIAGNOSIS — K219 Gastro-esophageal reflux disease without esophagitis: Secondary | ICD-10-CM | POA: Diagnosis not present

## 2019-01-06 DIAGNOSIS — R531 Weakness: Secondary | ICD-10-CM | POA: Diagnosis not present

## 2019-01-06 DIAGNOSIS — I6789 Other cerebrovascular disease: Secondary | ICD-10-CM | POA: Diagnosis not present

## 2019-01-06 DIAGNOSIS — E785 Hyperlipidemia, unspecified: Secondary | ICD-10-CM | POA: Diagnosis not present

## 2019-01-07 DIAGNOSIS — E119 Type 2 diabetes mellitus without complications: Secondary | ICD-10-CM | POA: Diagnosis not present

## 2019-01-07 DIAGNOSIS — I10 Essential (primary) hypertension: Secondary | ICD-10-CM | POA: Diagnosis not present

## 2019-01-07 DIAGNOSIS — M6281 Muscle weakness (generalized): Secondary | ICD-10-CM | POA: Diagnosis not present

## 2019-01-07 DIAGNOSIS — J449 Chronic obstructive pulmonary disease, unspecified: Secondary | ICD-10-CM | POA: Diagnosis not present

## 2019-01-07 DIAGNOSIS — F250 Schizoaffective disorder, bipolar type: Secondary | ICD-10-CM | POA: Diagnosis not present

## 2019-01-07 DIAGNOSIS — I69398 Other sequelae of cerebral infarction: Secondary | ICD-10-CM | POA: Diagnosis not present

## 2019-01-11 DIAGNOSIS — F250 Schizoaffective disorder, bipolar type: Secondary | ICD-10-CM | POA: Diagnosis not present

## 2019-01-11 DIAGNOSIS — I10 Essential (primary) hypertension: Secondary | ICD-10-CM | POA: Diagnosis not present

## 2019-01-11 DIAGNOSIS — I69398 Other sequelae of cerebral infarction: Secondary | ICD-10-CM | POA: Diagnosis not present

## 2019-01-11 DIAGNOSIS — M6281 Muscle weakness (generalized): Secondary | ICD-10-CM | POA: Diagnosis not present

## 2019-01-11 DIAGNOSIS — J449 Chronic obstructive pulmonary disease, unspecified: Secondary | ICD-10-CM | POA: Diagnosis not present

## 2019-01-11 DIAGNOSIS — E119 Type 2 diabetes mellitus without complications: Secondary | ICD-10-CM | POA: Diagnosis not present

## 2019-01-18 DIAGNOSIS — J449 Chronic obstructive pulmonary disease, unspecified: Secondary | ICD-10-CM | POA: Diagnosis not present

## 2019-01-18 DIAGNOSIS — M6281 Muscle weakness (generalized): Secondary | ICD-10-CM | POA: Diagnosis not present

## 2019-01-18 DIAGNOSIS — I10 Essential (primary) hypertension: Secondary | ICD-10-CM | POA: Diagnosis not present

## 2019-01-18 DIAGNOSIS — I69398 Other sequelae of cerebral infarction: Secondary | ICD-10-CM | POA: Diagnosis not present

## 2019-01-18 DIAGNOSIS — E119 Type 2 diabetes mellitus without complications: Secondary | ICD-10-CM | POA: Diagnosis not present

## 2019-01-18 DIAGNOSIS — F250 Schizoaffective disorder, bipolar type: Secondary | ICD-10-CM | POA: Diagnosis not present

## 2019-01-27 DIAGNOSIS — E119 Type 2 diabetes mellitus without complications: Secondary | ICD-10-CM | POA: Diagnosis not present

## 2019-01-27 DIAGNOSIS — I6789 Other cerebrovascular disease: Secondary | ICD-10-CM | POA: Diagnosis not present

## 2019-01-27 DIAGNOSIS — E785 Hyperlipidemia, unspecified: Secondary | ICD-10-CM | POA: Diagnosis not present

## 2019-01-27 DIAGNOSIS — N400 Enlarged prostate without lower urinary tract symptoms: Secondary | ICD-10-CM | POA: Diagnosis not present

## 2019-01-27 DIAGNOSIS — H8110 Benign paroxysmal vertigo, unspecified ear: Secondary | ICD-10-CM | POA: Diagnosis not present

## 2019-01-27 DIAGNOSIS — Z0000 Encounter for general adult medical examination without abnormal findings: Secondary | ICD-10-CM | POA: Diagnosis not present

## 2019-01-27 DIAGNOSIS — K219 Gastro-esophageal reflux disease without esophagitis: Secondary | ICD-10-CM | POA: Diagnosis not present

## 2019-01-27 DIAGNOSIS — M109 Gout, unspecified: Secondary | ICD-10-CM | POA: Diagnosis not present

## 2019-01-27 DIAGNOSIS — F319 Bipolar disorder, unspecified: Secondary | ICD-10-CM | POA: Diagnosis not present

## 2019-01-27 DIAGNOSIS — M549 Dorsalgia, unspecified: Secondary | ICD-10-CM | POA: Diagnosis not present

## 2019-01-31 DIAGNOSIS — F319 Bipolar disorder, unspecified: Secondary | ICD-10-CM | POA: Diagnosis not present

## 2019-01-31 DIAGNOSIS — Z7951 Long term (current) use of inhaled steroids: Secondary | ICD-10-CM | POA: Diagnosis not present

## 2019-01-31 DIAGNOSIS — R32 Unspecified urinary incontinence: Secondary | ICD-10-CM | POA: Diagnosis not present

## 2019-01-31 DIAGNOSIS — G40909 Epilepsy, unspecified, not intractable, without status epilepticus: Secondary | ICD-10-CM | POA: Diagnosis not present

## 2019-01-31 DIAGNOSIS — G8929 Other chronic pain: Secondary | ICD-10-CM | POA: Diagnosis not present

## 2019-01-31 DIAGNOSIS — M6281 Muscle weakness (generalized): Secondary | ICD-10-CM | POA: Diagnosis not present

## 2019-01-31 DIAGNOSIS — Z9682 Presence of neurostimulator: Secondary | ICD-10-CM | POA: Diagnosis not present

## 2019-01-31 DIAGNOSIS — M109 Gout, unspecified: Secondary | ICD-10-CM | POA: Diagnosis not present

## 2019-01-31 DIAGNOSIS — E669 Obesity, unspecified: Secondary | ICD-10-CM | POA: Diagnosis not present

## 2019-01-31 DIAGNOSIS — N400 Enlarged prostate without lower urinary tract symptoms: Secondary | ICD-10-CM | POA: Diagnosis not present

## 2019-01-31 DIAGNOSIS — I10 Essential (primary) hypertension: Secondary | ICD-10-CM | POA: Diagnosis not present

## 2019-01-31 DIAGNOSIS — Z9181 History of falling: Secondary | ICD-10-CM | POA: Diagnosis not present

## 2019-01-31 DIAGNOSIS — E785 Hyperlipidemia, unspecified: Secondary | ICD-10-CM | POA: Diagnosis not present

## 2019-01-31 DIAGNOSIS — F17210 Nicotine dependence, cigarettes, uncomplicated: Secondary | ICD-10-CM | POA: Diagnosis not present

## 2019-01-31 DIAGNOSIS — J449 Chronic obstructive pulmonary disease, unspecified: Secondary | ICD-10-CM | POA: Diagnosis not present

## 2019-01-31 DIAGNOSIS — D649 Anemia, unspecified: Secondary | ICD-10-CM | POA: Diagnosis not present

## 2019-01-31 DIAGNOSIS — Z87820 Personal history of traumatic brain injury: Secondary | ICD-10-CM | POA: Diagnosis not present

## 2019-01-31 DIAGNOSIS — K589 Irritable bowel syndrome without diarrhea: Secondary | ICD-10-CM | POA: Diagnosis not present

## 2019-01-31 DIAGNOSIS — G2581 Restless legs syndrome: Secondary | ICD-10-CM | POA: Diagnosis not present

## 2019-01-31 DIAGNOSIS — F250 Schizoaffective disorder, bipolar type: Secondary | ICD-10-CM | POA: Diagnosis not present

## 2019-01-31 DIAGNOSIS — K219 Gastro-esophageal reflux disease without esophagitis: Secondary | ICD-10-CM | POA: Diagnosis not present

## 2019-01-31 DIAGNOSIS — I69398 Other sequelae of cerebral infarction: Secondary | ICD-10-CM | POA: Diagnosis not present

## 2019-01-31 DIAGNOSIS — E119 Type 2 diabetes mellitus without complications: Secondary | ICD-10-CM | POA: Diagnosis not present

## 2020-03-14 IMAGING — DX DG FEMUR 2+V PORT*L*
1 series · 2 of 2 positions shown · non-contrast
Comparison: None.

CLINICAL DATA: 42 y/o  M; chain saw injury.

EXAM:
LEFT FEMUR PORTABLE 2 VIEWS

[Series 1: femur · 0.14mm/px · 2 of 2 slices shown]
[im 1/2]
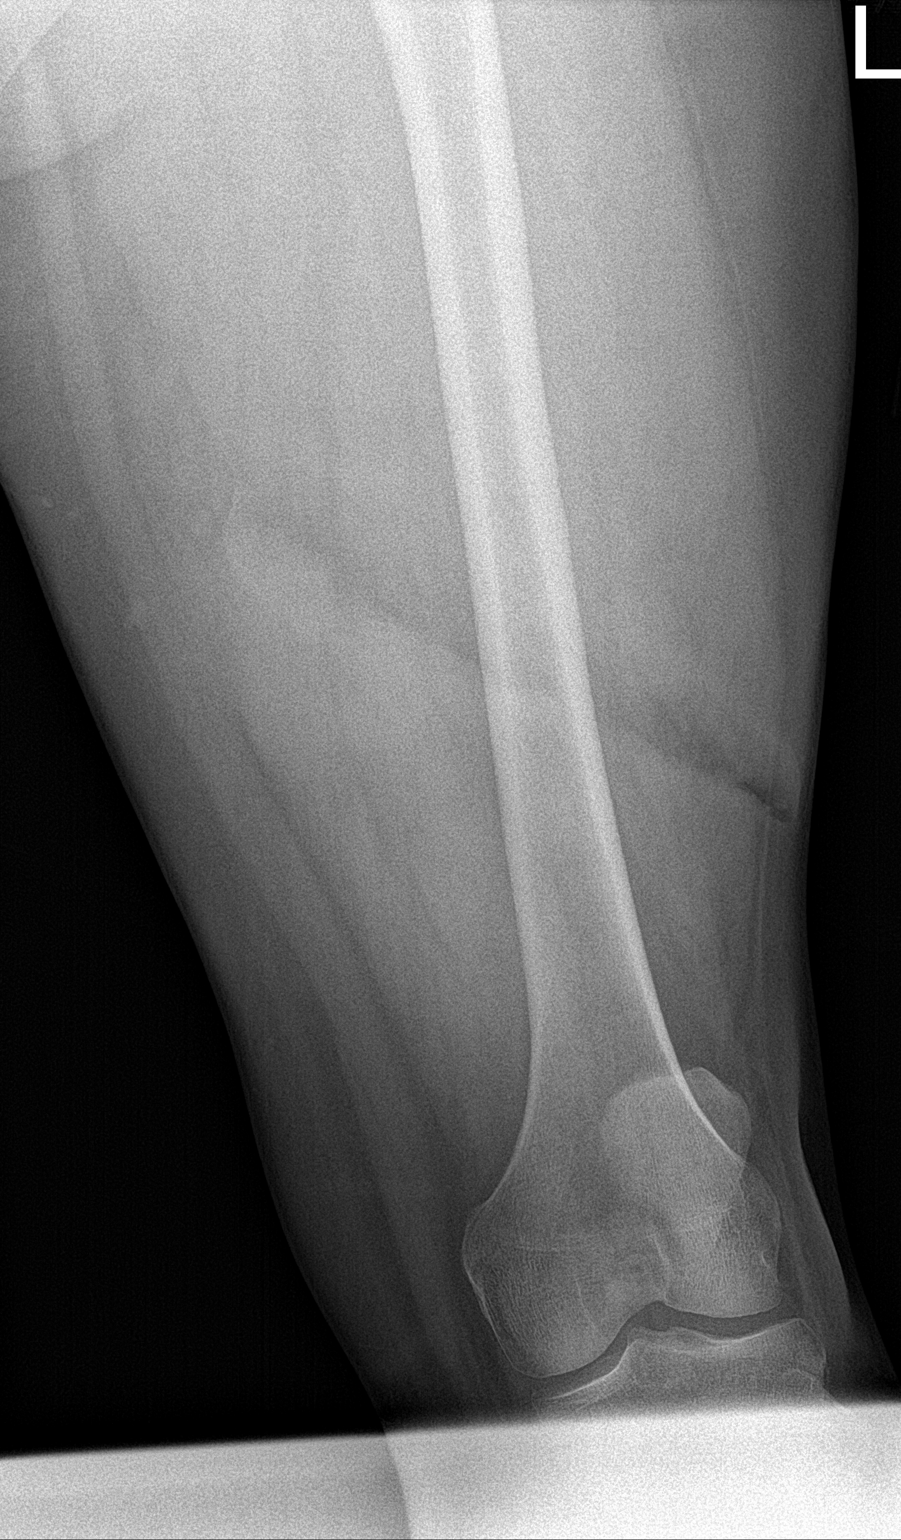
[im 2/2]
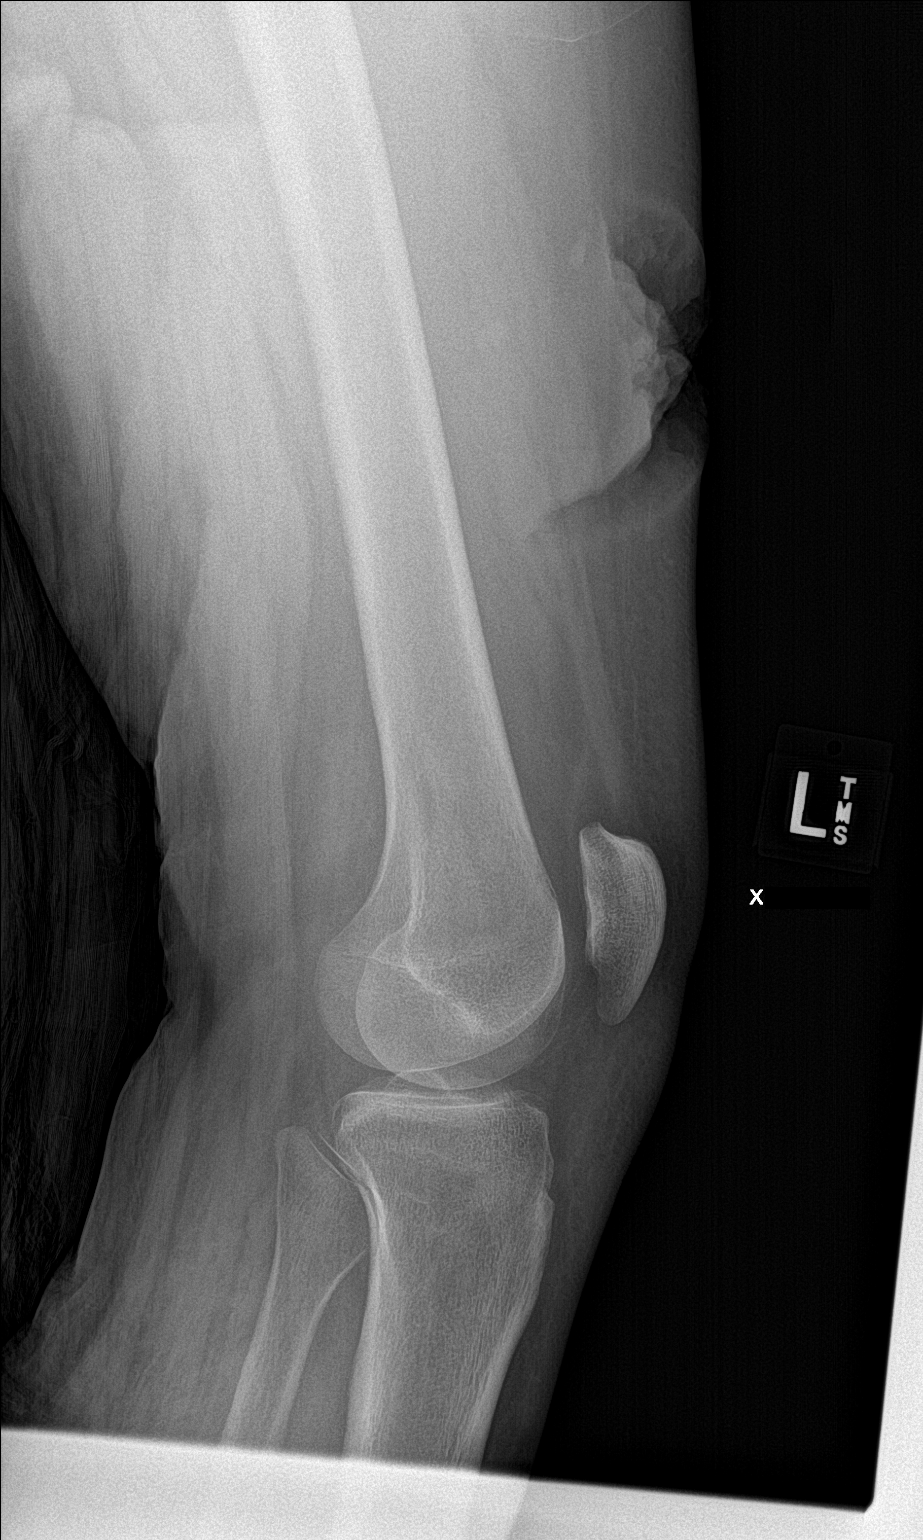

[2 of 2 positions shown; findings below may reference images not displayed]

FINDINGS: There is no evidence of fracture or other focal bone lesions. Large
anterior thigh soft tissue laceration.
IMPRESSION: No acute bony abnormality. Large anterior thigh soft tissue
laceration.

By: Melynda Billiot M.D.

## 2020-08-06 IMAGING — CT CT HEAD W/O CM
4 series · 16 of 47 positions shown, 18 images · non-contrast
Comparison: 11/07/2017

CLINICAL DATA: Seizure-like activity today. No injury. No history
of seizures.

EXAM:
CT HEAD WITHOUT CONTRAST
TECHNIQUE: Contiguous axial images were obtained from the base of the skull
through the vertex without intravenous contrast.

[Series 3: head wo · axial · 0.41mm/px · z∈[-98,+12]mm · 7 of 30 slices shown, 9 images]
[im 4/30  brain]
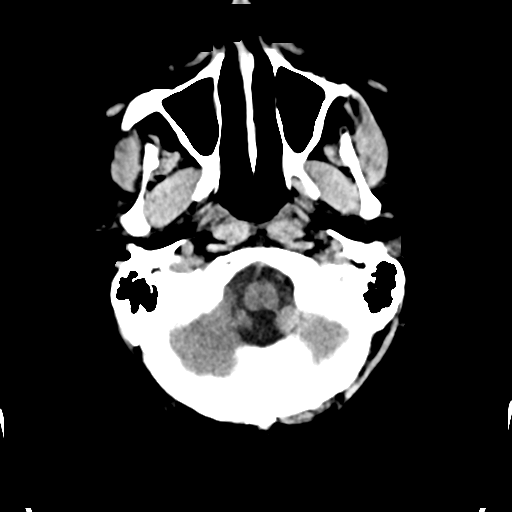
[im 4/30  bone]
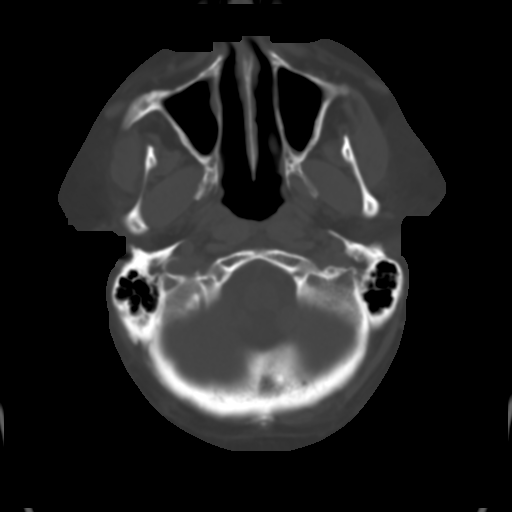
[im 8/30  brain]
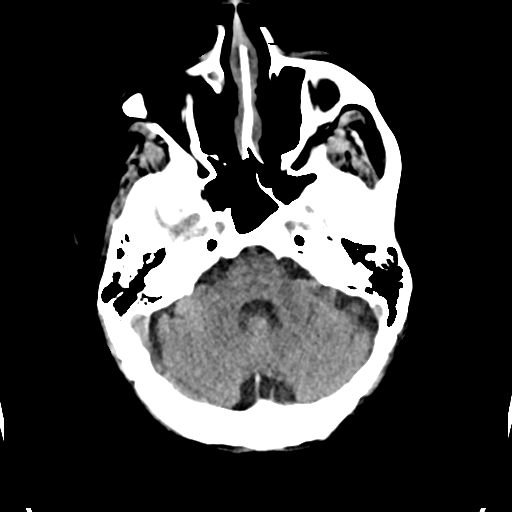
[im 11/30  brain]
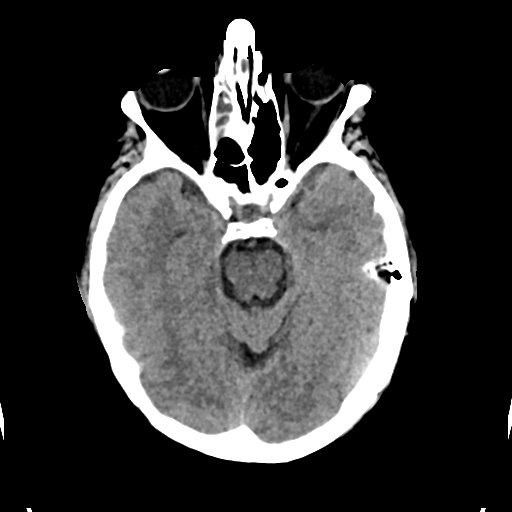
[im 15/30  brain]
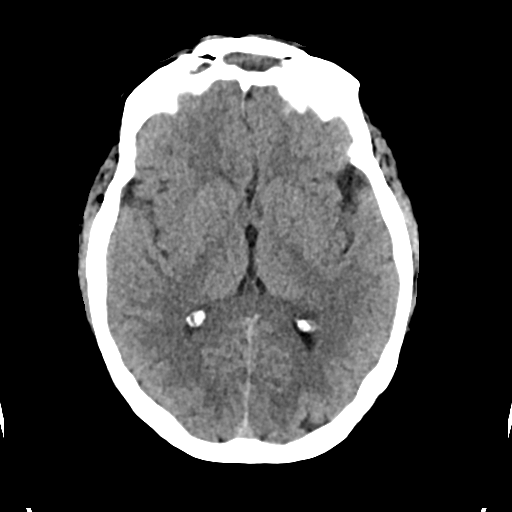
[im 19/30  brain]
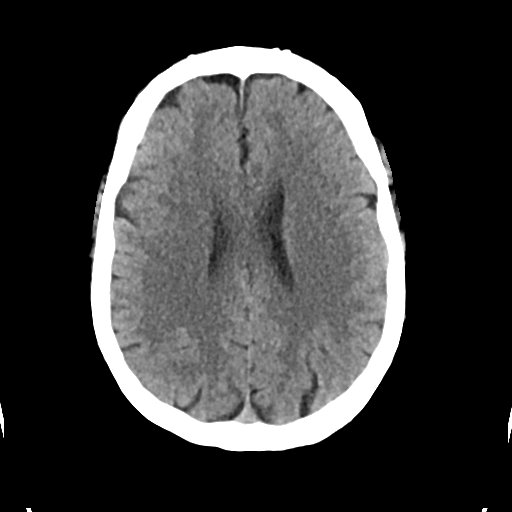
[im 19/30  bone]
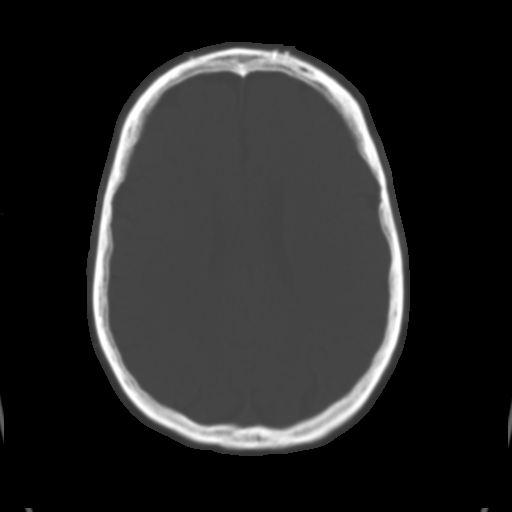
[im 22/30  brain]
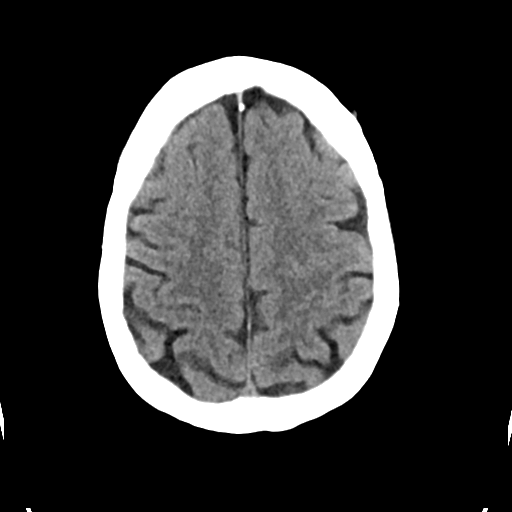
[im 26/30  brain]
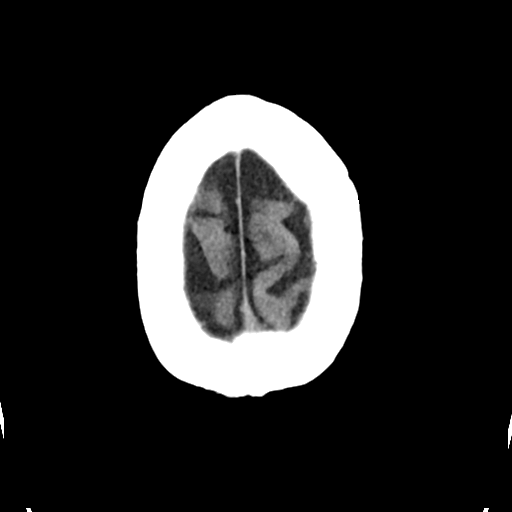

[Series 4: head bone · axial · 0.41mm/px · z∈[-99,-69]mm · 3 of 75 slices shown]
[im 8/75  bone]
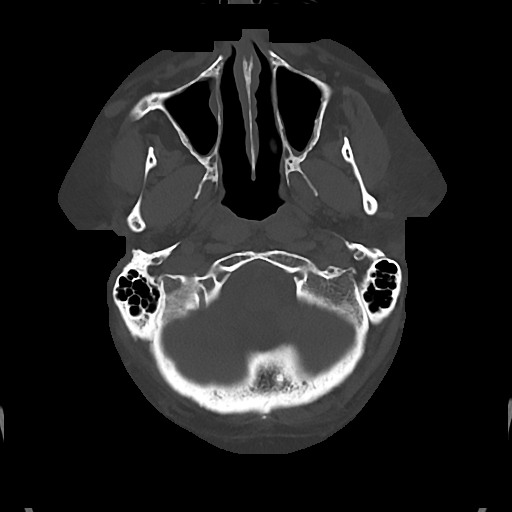
[im 15/75  bone]
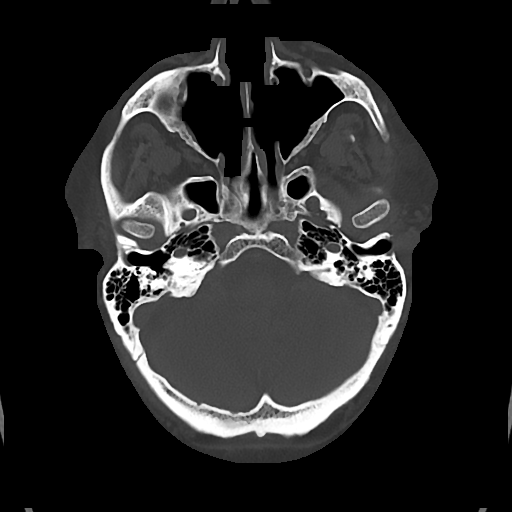
[im 23/75  bone]
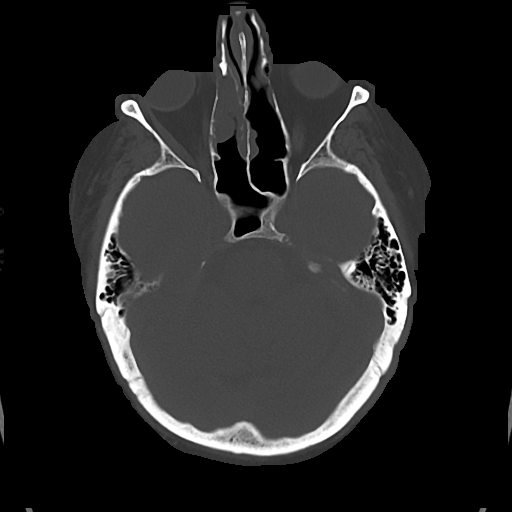

[Series 5: cor soft · coronal · 0.32mm/px · 3 of 61 slices shown]
[im 21/61  brain]
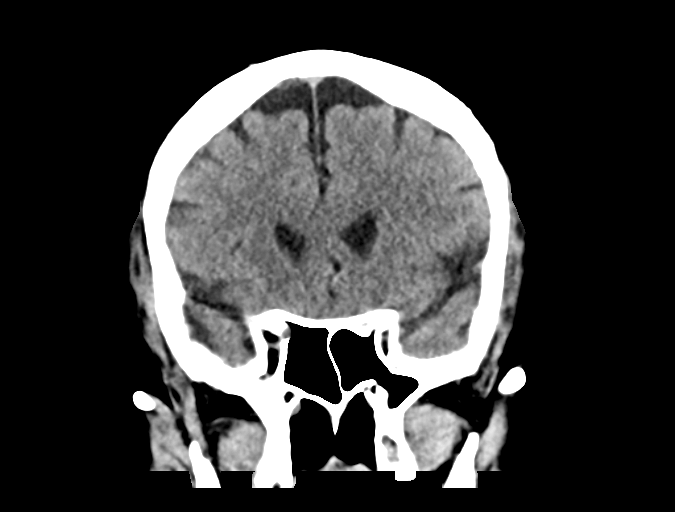
[im 27/61  brain]
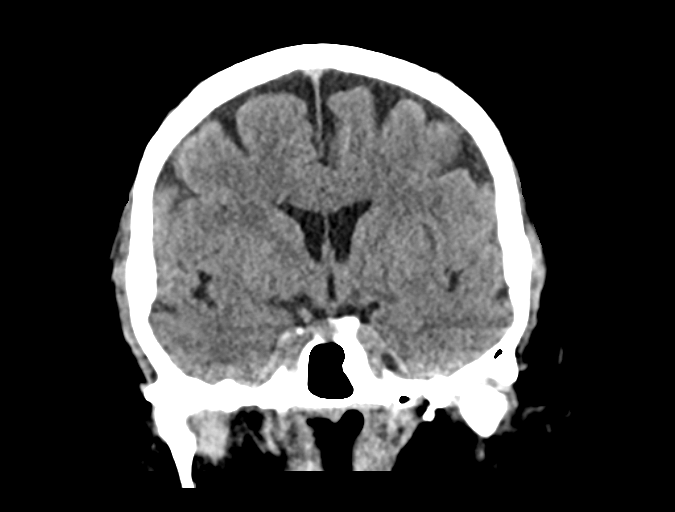
[im 34/61  brain]
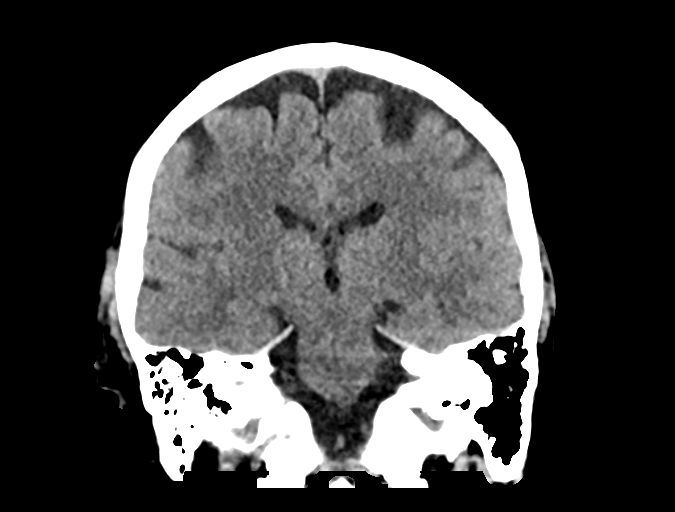

[Series 6: sag soft · sagittal · 0.33mm/px · 3 of 48 slices shown]
[im 16/48  brain]
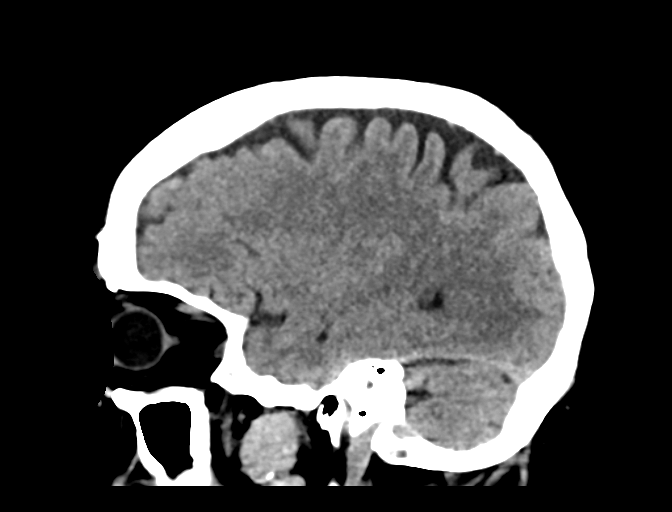
[im 24/48  brain]
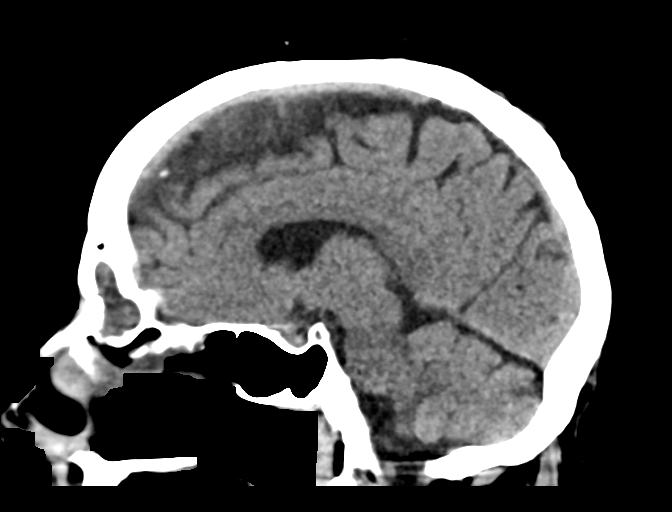
[im 32/48  brain]
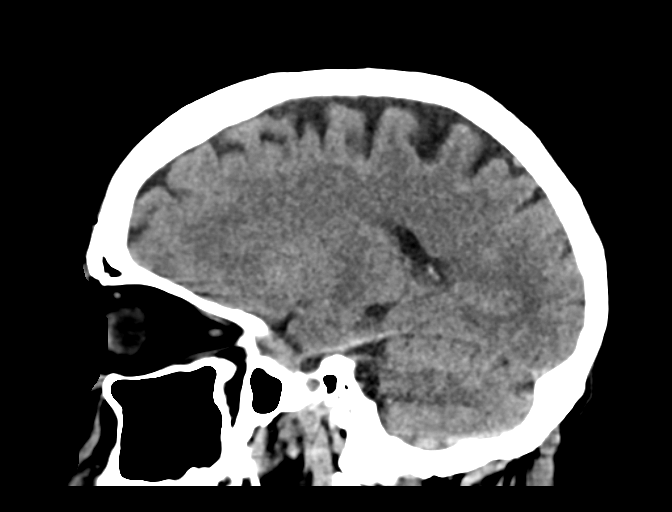

[16 of 47 positions shown; findings below may reference images not displayed]

FINDINGS: Brain: No evidence of acute infarction, hemorrhage, hydrocephalus,
extra-axial collection or mass lesion/mass effect.

Vascular: No hyperdense vessel or unexpected calcification.

Skull: Postsurgical change of the frontal skull.

Sinuses/Orbits: Orbits are normal and symmetric. Postsurgical change
over the frontal skull with stable complete opacification of the
frontal sinus and opacification over the ethmoid air cells right
greater than left. Evidence of previous fenestration of the medial
wall of the maxillary sinuses. Subtle mucosal membrane thickening
over the floor of the maxillary sinuses. Mastoid air cells are
clear.

Other: None.
IMPRESSION: No acute findings.

Postsurgical change of the frontal skull with chronic sinus
opacification as described.
